# Patient Record
Sex: Female | Born: 1950
Health system: Southern US, Community
[De-identification: ages and names within clinical notes are randomized; demographics above are authoritative.]

## PROBLEM LIST (undated history)

## (undated) DIAGNOSIS — I10 Essential (primary) hypertension: Secondary | ICD-10-CM

## (undated) DIAGNOSIS — E785 Hyperlipidemia, unspecified: Secondary | ICD-10-CM

## (undated) HISTORY — DX: Essential (primary) hypertension: I10

## (undated) HISTORY — PX: ABDOMINAL HYSTERECTOMY: SHX81

## (undated) HISTORY — DX: Hyperlipidemia, unspecified: E78.5

---

## 2004-02-06 ENCOUNTER — Other Ambulatory Visit: Admission: RE | Admit: 2004-02-06 | Discharge: 2004-02-06 | Payer: Self-pay | Admitting: Obstetrics and Gynecology

## 2004-02-21 ENCOUNTER — Encounter: Admission: RE | Admit: 2004-02-21 | Discharge: 2004-02-21 | Payer: Self-pay | Admitting: Obstetrics and Gynecology

## 2004-10-17 ENCOUNTER — Ambulatory Visit: Payer: Self-pay | Admitting: Internal Medicine

## 2004-11-04 ENCOUNTER — Emergency Department (HOSPITAL_COMMUNITY): Admission: EM | Admit: 2004-11-04 | Discharge: 2004-11-04 | Payer: Self-pay | Admitting: Emergency Medicine

## 2005-04-18 ENCOUNTER — Ambulatory Visit: Payer: Self-pay | Admitting: Internal Medicine

## 2007-03-19 ENCOUNTER — Ambulatory Visit: Payer: Self-pay | Admitting: Internal Medicine

## 2008-08-30 ENCOUNTER — Telehealth: Payer: Self-pay | Admitting: Internal Medicine

## 2008-09-05 ENCOUNTER — Ambulatory Visit: Payer: Self-pay | Admitting: Internal Medicine

## 2008-09-05 DIAGNOSIS — E785 Hyperlipidemia, unspecified: Secondary | ICD-10-CM

## 2008-09-05 DIAGNOSIS — I10 Essential (primary) hypertension: Secondary | ICD-10-CM

## 2008-09-05 HISTORY — DX: Essential (primary) hypertension: I10

## 2008-09-05 HISTORY — DX: Hyperlipidemia, unspecified: E78.5

## 2008-09-21 ENCOUNTER — Telehealth: Payer: Self-pay | Admitting: Internal Medicine

## 2008-10-12 ENCOUNTER — Ambulatory Visit: Payer: Self-pay | Admitting: Internal Medicine

## 2009-01-25 ENCOUNTER — Encounter: Admission: RE | Admit: 2009-01-25 | Discharge: 2009-01-25 | Payer: Self-pay | Admitting: Obstetrics and Gynecology

## 2009-07-10 ENCOUNTER — Telehealth: Payer: Self-pay | Admitting: Internal Medicine

## 2009-11-07 ENCOUNTER — Telehealth: Payer: Self-pay | Admitting: Internal Medicine

## 2009-12-05 ENCOUNTER — Telehealth: Payer: Self-pay | Admitting: Internal Medicine

## 2010-01-04 ENCOUNTER — Telehealth (INDEPENDENT_AMBULATORY_CARE_PROVIDER_SITE_OTHER): Payer: Self-pay | Admitting: *Deleted

## 2010-01-24 ENCOUNTER — Telehealth: Payer: Self-pay | Admitting: Internal Medicine

## 2010-02-19 ENCOUNTER — Telehealth: Payer: Self-pay | Admitting: Internal Medicine

## 2010-02-26 ENCOUNTER — Ambulatory Visit: Payer: Self-pay | Admitting: Internal Medicine

## 2011-01-08 NOTE — Progress Notes (Signed)
Summary: OTC MEDs  Phone Note Call from Patient Call back at Work Phone [phone]   Summary of Call: Patient would like otc suggestions for cough. Pt c/o chills &  productive cough with white mucus. No fever, body aches or sinus drainage. She is taking coricidin HBP with little relief.  Initial call taken by: Lamar Sprinkles, CMA,  February 19, 2010 10:31 AM  Follow-up for Phone Call        delsym is excellent for 12 hr cough suppression  - ok to try Follow-up by: Corwin Levins MD,  February 19, 2010 12:48 PM  Additional Follow-up for Phone Call Additional follow up Details #1::        pt informed Additional Follow-up by: Margaret Pyle, CMA,  February 19, 2010 1:39 PM

## 2011-01-08 NOTE — Progress Notes (Signed)
Summary: Accuretic/Norvasc refill  Phone Note Refill Request Message from:  Fax from Pharmacy on January 04, 2010 10:37 AM  Refills Requested: Medication #1:  ACCURETIC 20-12.5 MG TABS 1 by mouth daily   Dosage confirmed as above?Dosage Confirmed   Last Refilled: 12/05/2009  Medication #2:  NORVASC 5 MG TABS 1 by mouth daily   Dosage confirmed as above?Dosage Confirmed   Last Refilled: 12/05/2009 Initial call taken by: Lucious Groves,  January 04, 2010 10:36 AM  Follow-up for Phone Call        routine refills to [facility]bin Follow-up by: Corwin Levins MD,  January 04, 2010 1:16 PM    Prescriptions: NORVASC 5 MG TABS (AMLODIPINE BESYLATE) 1 by mouth daily  #30 x 0   Entered by:   Scharlene Gloss   Authorized by:   Corwin Levins MD   Signed by:   Scharlene Gloss on 01/04/2010   Method used:   Faxed to ...       [facility] Department (retail)       [address] [place], [place]  [address]       Ph: [phone]       Fax: [phone]   RxID:   [id number] ACCURETIC 20-12.5 MG TABS (QUINAPRIL-HYDROCHLOROTHIAZIDE) 1 by mouth daily  #30 x 0   Entered by:   Scharlene Gloss   Authorized by:   Corwin Levins MD   Signed by:   Scharlene Gloss on 01/04/2010   Method used:   Faxed to ...       [facility] Department (retail)       [address] [place], [place]  [address]       Ph: [phone]       Fax: [phone]   RxID:   [id number]

## 2011-01-08 NOTE — Assessment & Plan Note (Signed)
Summary: FOLLOW UP RX REFILL-LB   Vital Signs:  Patient profile:   60 year old female Height:      62 inches Weight:      166 pounds BMI:     30.47 O2 Sat:      98 % on Room air Temp:     97.3 degrees F oral Pulse rate:   100 / minute BP sitting:   140 / 84  (left arm) Cuff size:   regular  Vitals Entered ByZella Ball Ewing (February 26, 2010 2:59 PM)  O2 Flow:  Room air CC: followup on refills, chest congestion, cough/RE   CC:  followup on refills, chest congestion, and cough/RE.  History of Present Illness: here wtih acute onset non prod cough for one wk;  no ST, fever or wheezing; Pt denies CP, sob, doe, wheezing, orthopnea, pnd, worsening LE edema, palps, dizziness or syncope  Hard to sleep with the cough.  just started new job feb 21;  may have insurance later   Preventive Screening-Counseling & Management      Drug Use:  no.    Problems Prior to Update: 1)  Preventive Health Care  (ICD-V70.0) 2)  Hypertension  (ICD-401.9) 3)  Hyperlipidemia  (ICD-272.4)  Medications Prior to Update: 1)  Accuretic 20-12.5 Mg Tabs (Quinapril-Hydrochlorothiazide) .Marland Kitchen.. 1 By Mouth Daily 2)  Norvasc 5 Mg Tabs (Amlodipine Besylate) .Marland Kitchen.. 1 By Mouth Daily 3)  Aspir-81 81 Mg Tbec (Aspirin) 4)  Lipitor 10 Mg Tabs (Atorvastatin Calcium) .Marland Kitchen.. 1po Once Daily 5)  Pravachol 40 Mg Tabs (Pravastatin Sodium) .Marland Kitchen.. 1 By Mouth Once Daily  Current Medications (verified): 1)  Accuretic 20-12.5 Mg Tabs (Quinapril-Hydrochlorothiazide) .Marland Kitchen.. 1 By Mouth Daily 2)  Norvasc 5 Mg Tabs (Amlodipine Besylate) .Marland Kitchen.. 1 By Mouth Daily 3)  Aspir-81 81 Mg Tbec (Aspirin) 4)  Pravachol 40 Mg Tabs (Pravastatin Sodium) .Marland Kitchen.. 1 By Mouth Once Daily 5)  Claritin 10 Mg Tabs (Loratadine) .Marland Kitchen.. 1 By Mouth Once Daily As Needed 6)  Hydrocodone-Homatropine 5-1.5 Mg/62ml Syrp (Hydrocodone-Homatropine) .Marland Kitchen.. 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   Head:  normocephalic and atraumatic.   Eyes:  vision grossly intact, pupils equal, and pupils round.   Ears:  bilat tm's mild red, sinus nontender Nose:  nasal dischargemucosal pallor and mucosal erythema.   Mouth:  pharyngeal erythema and fair dentition.   Neck:  supple and no masses.   Lungs:  normal respiratory effort and normal breath sounds.   Heart:  normal rate and regular rhythm.   Extremities:  no edema, no erythema    Impression & Recommendations:  Problem # 1:  VIRAL INFECTION-UNSPEC (ICD-079.99)  Her updated medication list for this problem includes:    Aspir-81 81 Mg Tbec (Aspirin)    Hydrocodone-homatropine 5-1.5 Mg/88ml Syrp (Hydrocodone-homatropine) .Marland Kitchen... 1 tsp by  mouth q 6 hrs as needed cough treat as above, f/u any worsening signs or symptoms   Problem # 2:  HYPERTENSION (ICD-401.9)  Her updated medication list for this problem includes:    Accuretic 20-12.5 Mg Tabs (Quinapril-hydrochlorothiazide) .Marland Kitchen... 1 by mouth daily    Norvasc 5 Mg Tabs (Amlodipine besylate) .Marland Kitchen... 1 by mouth daily stable overall by hx and exam, ok to continue meds/tx as is   BP today: 140/84 Prior BP: 133/88 (10/12/2008)  Complete Medication List: 1)  Accuretic 20-12.5 Mg Tabs (Quinapril-hydrochlorothiazide) .Marland Kitchen.. 1 by mouth daily 2)  Norvasc 5 Mg Tabs (Amlodipine besylate) .Marland Kitchen.. 1 by  mouth daily 3)  Aspir-81 81 Mg Tbec (Aspirin) 4)  Pravachol 40 Mg Tabs (Pravastatin sodium) .Marland Kitchen.. 1 by mouth once daily 5)  Claritin 10 Mg Tabs (Loratadine) .Marland Kitchen.. 1 by mouth once daily as needed 6)  Hydrocodone-homatropine 5-1.5 Mg/20ml Syrp (Hydrocodone-homatropine) .Marland Kitchen.. 1 tsp by mouth q 6 hrs as needed cough  Patient Instructions: 1)  Please take all new medications as prescribed 2)  Continue all previous medications as before this visit  3)  You can also use Mucinex OTC or it's generic for congestion  4)  Please schedule a follow-up appointment in 6 months with CPX labs 5)  Check your Blood Pressure regularly. If it is above 140/90: you should make an appointment sooner Prescriptions: HYDROCODONE-HOMATROPINE 5-1.5 MG/5ML SYRP (HYDROCODONE-HOMATROPINE) 1 tsp by mouth q 6 hrs as needed cough  #6 oz x 1   Entered and Authorized by:   Corwin Levins MD   Signed by:   Corwin Levins MD on 02/26/2010   Method used:   Print then Give to Patient   RxID:   [id number] ACCURETIC 20-12.5 MG TABS (QUINAPRIL-HYDROCHLOROTHIAZIDE) 1 by mouth daily  #90 x 3   Entered and Authorized by:   Corwin Levins MD   Signed by:   Corwin Levins MD on 02/26/2010   Method used:   Print then Give to Patient   RxID:   [id number] NORVASC 5 MG TABS (AMLODIPINE BESYLATE) 1 by mouth daily  #90 x 3   Entered and Authorized by:   Corwin Levins MD   Signed by:   Corwin Levins MD on 02/26/2010   Method used:   Print then Give to Patient   RxID:   [id number] PRAVACHOL 40 MG TABS (PRAVASTATIN SODIUM) 1 by mouth once daily  #90 x 3   Entered and Authorized by:   Corwin Levins MD   Signed by:   Corwin Levins MD on 02/26/2010   Method used:   Print then Give to Patient   RxID:   [id number]

## 2011-01-08 NOTE — Progress Notes (Signed)
Summary: Lipitor switch  Phone Note Call from Patient Call back at Work Phone [phone]   Caller: Patient Summary of Call: pt called stating that she would like to be switched back to Pravachol. Pt says she is not "comfortable" with Lipitor (pt denied side effects). I reminded pt that Rx was changed originally be cause [facility] said is was no longer free. Pt acknowlegded and said she was prepared to pay out-of-pocket.  Pt request Rx be sent to [facility]. Initial call taken by: Margaret Pyle, CMA,  January 24, 2010 4:09 PM  Follow-up for Phone Call        ok for pravachol 40 mg - to [facility] to handle Follow-up by: Corwin Levins MD,  January 24, 2010 5:15 PM    New/Updated Medications: PRAVACHOL 40 MG TABS (PRAVASTATIN SODIUM) 1 by mouth once daily Prescriptions: PRAVACHOL 40 MG TABS (PRAVASTATIN SODIUM) 1 by mouth once daily  #90 x 3   Entered by:   Scharlene Gloss   Authorized by:   Corwin Levins MD   Signed by:   Scharlene Gloss on 01/25/2010   Method used:   Faxed to ...       [facility] Department (retail)       [address] [place], [place]  [address]       Ph: [phone]       Fax: [phone]   RxID:   [id number]

## 2011-03-11 ENCOUNTER — Other Ambulatory Visit: Payer: Self-pay

## 2011-03-11 MED ORDER — AMLODIPINE BESYLATE 5 MG PO TABS: 5.0000 mg | ORAL_TABLET | Freq: Every day | ORAL | Status: DC

## 2011-03-11 MED ORDER — QUINAPRIL-HYDROCHLOROTHIAZIDE 20-12.5 MG PO TABS: 1.0000 | ORAL_TABLET | Freq: Every day | ORAL | Status: DC

## 2011-03-11 NOTE — Telephone Encounter (Signed)
Refill request Accuretic 2-/12.5 and Norvasc 5 mg. Left refill 02/26/10 last ov 02/17/10.

## 2011-04-17 ENCOUNTER — Other Ambulatory Visit: Payer: Self-pay

## 2011-04-17 MED ORDER — QUINAPRIL-HYDROCHLOROTHIAZIDE 20-12.5 MG PO TABS: 1.0000 | ORAL_TABLET | Freq: Every day | ORAL | Status: DC

## 2011-04-19 ENCOUNTER — Other Ambulatory Visit: Payer: Self-pay

## 2011-04-19 MED ORDER — AMLODIPINE BESYLATE 5 MG PO TABS: 5.0000 mg | ORAL_TABLET | Freq: Every day | ORAL | Status: DC

## 2011-04-25 ENCOUNTER — Encounter: Payer: Self-pay | Admitting: Internal Medicine

## 2011-04-25 ENCOUNTER — Other Ambulatory Visit (INDEPENDENT_AMBULATORY_CARE_PROVIDER_SITE_OTHER): Payer: Self-pay

## 2011-04-25 ENCOUNTER — Telehealth: Payer: Self-pay

## 2011-04-25 ENCOUNTER — Ambulatory Visit (INDEPENDENT_AMBULATORY_CARE_PROVIDER_SITE_OTHER): Payer: Self-pay | Admitting: Internal Medicine

## 2011-04-25 DIAGNOSIS — Z79899 Other long term (current) drug therapy: Secondary | ICD-10-CM | POA: Insufficient documentation

## 2011-04-25 DIAGNOSIS — Z0001 Encounter for general adult medical examination with abnormal findings: Secondary | ICD-10-CM | POA: Insufficient documentation

## 2011-04-25 DIAGNOSIS — E785 Hyperlipidemia, unspecified: Secondary | ICD-10-CM

## 2011-04-25 DIAGNOSIS — J309 Allergic rhinitis, unspecified: Secondary | ICD-10-CM | POA: Insufficient documentation

## 2011-04-25 DIAGNOSIS — I10 Essential (primary) hypertension: Secondary | ICD-10-CM

## 2011-04-25 DIAGNOSIS — Z0000 Encounter for general adult medical examination without abnormal findings: Secondary | ICD-10-CM | POA: Insufficient documentation

## 2011-04-25 LAB — BASIC METABOLIC PANEL
BUN: 7 mg/dL (ref 6–23)
CO2: 30 mEq/L (ref 19–32)
Calcium: 9.1 mg/dL (ref 8.4–10.5)
Chloride: 105 mEq/L (ref 96–112)
Creatinine, Ser: 0.7 mg/dL (ref 0.4–1.2)
GFR: 116.08 mL/min (ref >60.00)
Glucose, Bld: 85 mg/dL (ref 70–99)
Potassium: 3.9 mEq/L (ref 3.5–5.1)
Sodium: 141 mEq/L (ref 135–145)

## 2011-04-25 LAB — HEPATIC FUNCTION PANEL
ALT: 15 U/L (ref 0–35)
AST: 24 U/L (ref 0–37)
Albumin: 3.7 g/dL (ref 3.5–5.2)
Alkaline Phosphatase: 80 U/L (ref 39–117)
Bilirubin, Direct: 0 mg/dL (ref 0.0–0.3)
Total Bilirubin: 0.5 mg/dL (ref 0.3–1.2)
Total Protein: 6.7 g/dL (ref 6.0–8.3)

## 2011-04-25 LAB — LIPID PANEL
Cholesterol: 193 mg/dL (ref 0–200)
HDL: 74.9 mg/dL (ref >39.00)
LDL Cholesterol: 96 mg/dL (ref 0–99)
Total CHOL/HDL Ratio: 3
Triglycerides: 110 mg/dL (ref 0.0–149.0)
VLDL: 22 mg/dL (ref 0.0–40.0)

## 2011-04-25 MED ORDER — FEXOFENADINE HCL 180 MG PO TABS: 180.0000 mg | ORAL_TABLET | Freq: Every day | ORAL | Status: DC

## 2011-04-25 MED ORDER — QUINAPRIL-HYDROCHLOROTHIAZIDE 20-12.5 MG PO TABS: 1.0000 | ORAL_TABLET | Freq: Every day | ORAL | Status: DC

## 2011-04-25 MED ORDER — PRAVASTATIN SODIUM 40 MG PO TABS: 40.0000 mg | ORAL_TABLET | Freq: Every evening | ORAL | Status: DC

## 2011-04-25 MED ORDER — AMLODIPINE BESYLATE 5 MG PO TABS: 5.0000 mg | ORAL_TABLET | Freq: Every day | ORAL | Status: DC

## 2011-04-25 NOTE — Patient Instructions (Signed)
Continue all other medications as before Please go to LAB in the Basement for the blood and/or urine tests to be done today Please call the phone number [phone] (the PhoneTree System) for results of testing in 2-3 days;  When calling, simply dial the number, and when prompted enter the MRN number above (the Medical Record Number) and the # key, then the message should start. Please return in 1 year for your yearly visit, or sooner if needed

## 2011-04-25 NOTE — Telephone Encounter (Signed)
Ok to generate rx - to robin to handle

## 2011-04-25 NOTE — Progress Notes (Signed)
Quick Note:  Voice message left on PhoneTree system - lab is negative, normal or otherwise stable, pt to continue same tx ______ 

## 2011-04-25 NOTE — Telephone Encounter (Signed)
[facility]ax from [facility]. They can no longer obtain Allegra through patient assistance, would you be willing to change to Clarinex 5 mg qd.  Clarinex is the only antihistamine they can obtain free. Please advise.

## 2011-04-25 NOTE — Progress Notes (Signed)
  Subjective:    Patient ID: Claudia Myers, female    DOB: 12-01-1951, 60 y.o.   MRN: [id number]  HPI  Here for wellness and f/u;  Overall doing ok;  Pt denies CP, worsening SOB, DOE, wheezing, orthopnea, PND, worsening LE edema, palpitations, dizziness or syncope.  Pt denies neurological change such as new Headache, facial or extremity weakness.  Pt denies polydipsia, polyuria, or low sugar symptoms. Pt states overall good compliance with treatment and medications, good tolerability, and trying to follow lower cholesterol diet.  Pt denies worsening depressive symptoms, suicidal ideation or panic. No fever, wt loss, night sweats, loss of appetite, or other constitutional symptoms.  Pt states good ability with ADL's, low fall risk, home safety reviewed and adequate, no significant changes in hearing or vision, and occasionally active with exercise.  Works at new job locally. Does have several wks ongoing nasal allergy symptoms with clear congestion, itch and sneeze, without fever, pain, ST, cough or wheezing.  Past Medical History  Diagnosis Date  . HYPERLIPIDEMIA 09/05/2008  . HYPERTENSION 09/05/2008   Past Surgical History  Procedure Date  . Abdominal hysterectomy     reports that she has never smoked. She does not have any smokeless tobacco history on file. She reports that she drinks alcohol. She reports that she does not use illicit drugs. family history includes Diabetes in her mother; Hypertension in her mother; Peripheral vascular disease in her mother; and Stroke in her mother. No Known Allergies Current Outpatient Prescriptions on File Prior to Visit  Medication Sig Dispense Refill  . aspirin 81 MG EC tablet Take 81 mg by mouth daily.        Marland Kitchen DISCONTD: amLODipine (NORVASC) 5 MG tablet Take 1 tablet (5 mg total) by mouth daily.  30 tablet  0  . DISCONTD: quinapril-hydrochlorothiazide (ACCURETIC) 20-12.5 MG per tablet Take 1 tablet by mouth daily.  30 tablet  0  . HYDROcodone-homatropine  (HYCODAN) 5-1.5 MG/5ML syrup 1 teaspoon by mouth every 6 hours as needed for cough       . DISCONTD: atorvastatin (LIPITOR) 20 MG tablet Take 20 mg by mouth daily.        Marland Kitchen DISCONTD: loratadine (CLARITIN) 10 MG tablet Take 10 mg by mouth daily.         Review of Systems All otherwise neg per pt     Objective:   Physical Exam BP 140/100  Pulse 73  Temp(Src) 98.7 F (37.1 C) (Oral)  Ht 5\' 3"  (1.6 m)  Wt 180 lb 8 oz (81.874 kg)  BMI 31.97 kg/m2  SpO2 99% Physical Exam  VS noted Constitutional: Pt appears well-developed and well-nourished.  HENT: Head: Normocephalic.  Right Ear: External ear normal.  Left Ear: External ear normal.  Eyes: Conjunctivae and EOM are normal. Pupils are equal, round, and reactive to light.  Neck: Normal range of motion. Neck supple.  Cardiovascular: Normal rate and regular rhythm.   Pulmonary/Chest: Effort normal and breath sounds normal.  Abd:  Soft, NT, non-distended, + BS Neurological: Pt is alert. No cranial nerve deficit.  Skin: Skin is warm. No erythema.  Psychiatric: Pt behavior is normal. Thought content normal.         Assessment & Plan:

## 2011-04-26 ENCOUNTER — Other Ambulatory Visit: Payer: Self-pay

## 2011-04-26 MED ORDER — DESLORATADINE 5 MG PO TABS: 5.0000 mg | ORAL_TABLET | Freq: Every day | ORAL | Status: DC

## 2011-04-26 NOTE — Telephone Encounter (Signed)
Sent new prescription to pharmacy

## 2011-04-28 ENCOUNTER — Encounter: Payer: Self-pay | Admitting: Internal Medicine

## 2011-04-28 NOTE — Assessment & Plan Note (Signed)
Mild, for allegra prn,  to f/u any worsening symptoms or concerns 

## 2011-04-28 NOTE — Assessment & Plan Note (Signed)
stable overall by hx and exam, most recent lab reviewed with pt, and pt to continue medical treatment as before  Lab Results  Component Value Date   LDLCALC 96 04/25/2011

## 2011-04-28 NOTE — Assessment & Plan Note (Signed)
stable overall by hx and exam, most recent lab reviewed with pt, and pt to continue medical treatment as before BP Readings from Last 3 Encounters:  04/25/11 140/100  02/26/10 140/84  10/12/08 133/88

## 2011-11-05 ENCOUNTER — Other Ambulatory Visit: Payer: Self-pay | Admitting: Obstetrics and Gynecology

## 2011-11-05 ENCOUNTER — Other Ambulatory Visit: Payer: Self-pay | Admitting: Nurse Practitioner

## 2011-11-05 DIAGNOSIS — Z1231 Encounter for screening mammogram for malignant neoplasm of breast: Secondary | ICD-10-CM

## 2011-11-13 ENCOUNTER — Ambulatory Visit
Admission: RE | Admit: 2011-11-13 | Discharge: 2011-11-13 | Disposition: A | Discharge status: Home / Self Care | Payer: Self-pay | Source: Ambulatory Visit | Attending: Nurse Practitioner | Admitting: Nurse Practitioner

## 2011-11-13 DIAGNOSIS — Z1231 Encounter for screening mammogram for malignant neoplasm of breast: Secondary | ICD-10-CM

## 2012-05-13 ENCOUNTER — Other Ambulatory Visit: Payer: Self-pay

## 2012-05-13 MED ORDER — QUINAPRIL-HYDROCHLOROTHIAZIDE 20-12.5 MG PO TABS: 1.0000 | ORAL_TABLET | Freq: Every day | ORAL | Status: DC

## 2012-05-18 ENCOUNTER — Other Ambulatory Visit: Payer: Self-pay | Admitting: *Deleted

## 2012-05-18 NOTE — Telephone Encounter (Signed)
Request Clarinex 5 mg refill [last refill 05.18.13 #90x3-see phone note]-too soon to fill/SLS Denied request.

## 2012-05-21 ENCOUNTER — Ambulatory Visit (INDEPENDENT_AMBULATORY_CARE_PROVIDER_SITE_OTHER): Payer: Self-pay | Admitting: Internal Medicine

## 2012-05-21 ENCOUNTER — Encounter: Payer: Self-pay | Admitting: Internal Medicine

## 2012-05-21 VITALS — BP 132/86 | HR 83 | Temp 98.6°F | Ht 63.0 in | Wt 187.1 lb

## 2012-05-21 DIAGNOSIS — I10 Essential (primary) hypertension: Secondary | ICD-10-CM

## 2012-05-21 DIAGNOSIS — J309 Allergic rhinitis, unspecified: Secondary | ICD-10-CM

## 2012-05-21 DIAGNOSIS — E785 Hyperlipidemia, unspecified: Secondary | ICD-10-CM

## 2012-05-21 MED ORDER — AMLODIPINE BESYLATE 5 MG PO TABS: 5.0000 mg | ORAL_TABLET | Freq: Every day | ORAL | Status: DC

## 2012-05-21 MED ORDER — QUINAPRIL-HYDROCHLOROTHIAZIDE 20-12.5 MG PO TABS: 1.0000 | ORAL_TABLET | Freq: Every day | ORAL | Status: DC

## 2012-05-21 MED ORDER — PRAVASTATIN SODIUM 40 MG PO TABS: 40.0000 mg | ORAL_TABLET | Freq: Every evening | ORAL | Status: DC

## 2012-05-21 NOTE — Progress Notes (Signed)
Subjective:    Patient ID: Claudia Myers, female    DOB: 14-Jan-1951, 61 y.o.   MRN: [id number]  HPI  Here to f/u; overall doing ok,  Pt denies chest pain, increased sob or doe, wheezing, orthopnea, PND, increased LE swelling, palpitations, dizziness or syncope.  Pt denies new neurological symptoms such as new headache, or facial or extremity weakness or numbness   Pt denies polydipsia, polyuria  Pt states overall good compliance with meds, trying to follow lower cholesterol diet, wt overall stable but little exercise however.  Fell at work in Nov, had right knee arthroscopy, with pain to lower back and knee, had MRI knee and procedure per Dr Gaspar Bidding  Has bone density as part of the evaluation - normal per pt Done with PT, no further falls, but has some pain with rainy weather.  Unfort can go back to previous position at [employer] as cant stand for work per ortho due to back and knee pain, and no sitting positions avail.  Not working now, none since jan 2013, thinking of applying for disability.  Pt continues to have recurring LBP without change in severity, bowel or bladder change, fever, wt loss,  worsening LE pain/numbness/weakness, gait change or falls.   Pt denies fever, wt loss, night sweats, loss of appetite, or other constitutional symptoms Past Medical History  Diagnosis Date  . HYPERLIPIDEMIA 09/05/2008  . HYPERTENSION 09/05/2008   Past Surgical History  Procedure Date  . Abdominal hysterectomy     reports that she has never smoked. She does not have any smokeless tobacco history on file. She reports that she drinks alcohol. She reports that she does not use illicit drugs. family history includes Diabetes in her mother; Hypertension in her mother; Peripheral vascular disease in her mother; and Stroke in her mother. No Known Allergies Current Outpatient Prescriptions on File Prior to Visit  Medication Sig Dispense Refill  . aspirin 81 MG EC tablet Take 81 mg by mouth daily.        .  quinapril-hydrochlorothiazide (ACCURETIC) 20-12.5 MG per tablet Take 1 tablet by mouth daily.  90 tablet  3  . amLODipine (NORVASC) 5 MG tablet Take 1 tablet (5 mg total) by mouth daily.  90 tablet  3  . desloratadine (CLARINEX) 5 MG tablet Take 1 tablet (5 mg total) by mouth daily.  90 tablet  3  . fexofenadine (ALLEGRA) 180 MG tablet Take 1 tablet (180 mg total) by mouth daily.  90 tablet  3  . HYDROcodone-homatropine (HYCODAN) 5-1.5 MG/5ML syrup 1 teaspoon by mouth every 6 hours as needed for cough       . pravastatin (PRAVACHOL) 40 MG tablet Take 1 tablet (40 mg total) by mouth every evening.  90 tablet  3   Review of Systems Review of Systems  Constitutional: Negative for diaphoresis and unexpected weight change.  HENT: Negative for drooling and tinnitus.   Eyes: Negative for photophobia and visual disturbance.  Respiratory: Negative for choking and stridor.   Gastrointestinal: Negative for vomiting and blood in stool.  Genitourinary: Negative for hematuria and decreased urine volume.  Musculoskeletal: Negative for gait problem.  Skin: Negative for color change and wound.  Neurological: Negative for tremors and numbness.  Psychiatric/Behavioral: Negative for decreased concentration. The patient is not hyperactive.       Objective:   Physical Exam BP 132/86  Pulse 83  Temp 98.6 F (37 C) (Oral)  Ht 5\' 3"  (1.6 m)  Wt 187 lb 2 oz (  84.879 kg)  BMI 33.15 kg/m2  SpO2 99% Physical Exam  VS noted Constitutional: Pt appears well-developed and well-nourished.  HENT: Head: Normocephalic.  Right Ear: External ear normal.  Left Ear: External ear normal.  Eyes: Conjunctivae and EOM are normal. Pupils are equal, round, and reactive to light.  Neck: Normal range of motion. Neck supple.  Cardiovascular: Normal rate and regular rhythm.   Pulmonary/Chest: Effort normal and breath sounds normal.  Abd:  Soft, NT, non-distended, + BS Neurological: Pt is alert. No cranial nerve deficit.    Skin: Skin is warm. No erythema.  Psychiatric: Pt behavior is normal. Thought content normal.     Assessment & Plan:

## 2012-05-21 NOTE — Assessment & Plan Note (Signed)
stable overall by hx and exam, most recent data reviewed with pt, and pt to continue medical treatment as before BP Readings from Last 3 Encounters:  05/21/12 132/86  04/25/11 140/100  02/26/10 140/84

## 2012-05-21 NOTE — Assessment & Plan Note (Signed)
stable overall by hx and exam, , and pt to continue medical treatment as before, for allegra otc prn

## 2012-05-21 NOTE — Patient Instructions (Addendum)
Continue all other medications as before You are given the hardcopy of your refills today Remember to "shop around" for the best prices, such as [facility], [employer] or [employer] Please return in 1 year for your yearly visit, or sooner if needed You may also wish to consider applying for the "[facility]" that [facility] system grants for some people who applY at the [facility] office

## 2012-05-21 NOTE — Assessment & Plan Note (Signed)
stable overall by hx and exam, most recent data reviewed with pt, and pt to continue medical treatment as before Lab Results  Component Value Date   LDLCALC 96 04/25/2011   [facility] lab today due to cost

## 2012-05-27 ENCOUNTER — Other Ambulatory Visit: Payer: Self-pay

## 2012-05-27 MED ORDER — DESLORATADINE 5 MG PO TABS: 5.0000 mg | ORAL_TABLET | Freq: Every day | ORAL | Status: DC

## 2012-07-30 ENCOUNTER — Telehealth: Payer: Self-pay | Admitting: Internal Medicine

## 2012-07-30 NOTE — Telephone Encounter (Signed)
Caller: Claudia Myers/Patient; Phone: [phone]; Reason for Call: Please call pt to advise when she started seeing Dr Jonny Ruiz; she thinks was 79 but needs month/year.

## 2012-07-31 NOTE — Telephone Encounter (Signed)
Patient informed. 

## 2013-01-05 ENCOUNTER — Ambulatory Visit: Payer: Self-pay

## 2013-01-18 ENCOUNTER — Telehealth: Payer: Self-pay | Admitting: Internal Medicine

## 2013-01-18 MED ORDER — IRBESARTAN-HYDROCHLOROTHIAZIDE 150-12.5 MG PO TABS: 1.0000 | ORAL_TABLET | Freq: Every day | ORAL | Status: DC

## 2013-01-18 NOTE — Telephone Encounter (Signed)
Ok to change to avalide 150/12.20m which should be roughly equivalent to the accuretic she was taking (even the though it was only 20/12.5)

## 2013-01-18 NOTE — Telephone Encounter (Signed)
Faxed hardcopy to [facility]. And informed the patient.

## 2013-01-18 NOTE — Telephone Encounter (Signed)
Patient is requesting a change to her accuretic medication because she is having a bad cough

## 2013-01-27 ENCOUNTER — Telehealth: Payer: Self-pay

## 2013-01-27 MED ORDER — TELMISARTAN-HCTZ 40-12.5 MG PO TABS: 1.0000 | ORAL_TABLET | Freq: Every day | ORAL | Status: DC

## 2013-01-27 NOTE — Telephone Encounter (Signed)
Faxed to [facility].

## 2013-01-27 NOTE — Telephone Encounter (Signed)
rx changed and sent erx

## 2013-01-27 NOTE — Telephone Encounter (Signed)
[facility]. Fax to inform they no longer get avalide would you consider switching  The patient to Micardis HCT or Benicar HCT,  Patient can get free.

## 2013-02-25 ENCOUNTER — Other Ambulatory Visit: Payer: Self-pay

## 2013-02-25 MED ORDER — AMLODIPINE BESYLATE 5 MG PO TABS: 5.0000 mg | ORAL_TABLET | Freq: Every day | ORAL | Status: DC

## 2013-04-29 ENCOUNTER — Telehealth: Payer: Self-pay

## 2013-04-29 NOTE — Telephone Encounter (Signed)
Phone call from pt. Message she left on triage was not understood, voice was muffled. Called her back asking her to call again so we can assist her.

## 2013-05-25 ENCOUNTER — Other Ambulatory Visit: Payer: Self-pay

## 2013-05-25 MED ORDER — AMLODIPINE BESYLATE 5 MG PO TABS: 5.0000 mg | ORAL_TABLET | Freq: Every day | ORAL | Status: DC

## 2015-06-28 ENCOUNTER — Telehealth: Payer: Self-pay | Admitting: Internal Medicine

## 2015-06-28 NOTE — Telephone Encounter (Signed)
Pt called in and wanted to know if you would take her back on has a pt?    Last seen 06/01/2012

## 2015-06-29 NOTE — Telephone Encounter (Signed)
Ok with me 

## 2015-06-30 NOTE — Telephone Encounter (Signed)
Patient currently does not have insurance.  Patient will call back to make an appointment once she has that in place.

## 2016-10-23 ENCOUNTER — Emergency Department (HOSPITAL_COMMUNITY)
Admission: EM | Admit: 2016-10-23 | Discharge: 2016-10-24 | Disposition: A | Discharge status: Home / Self Care | Payer: Self-pay | Attending: Emergency Medicine | Admitting: Emergency Medicine

## 2016-10-23 DIAGNOSIS — I10 Essential (primary) hypertension: Secondary | ICD-10-CM | POA: Insufficient documentation

## 2016-10-23 DIAGNOSIS — Z7982 Long term (current) use of aspirin: Secondary | ICD-10-CM | POA: Insufficient documentation

## 2016-10-23 DIAGNOSIS — Z79899 Other long term (current) drug therapy: Secondary | ICD-10-CM | POA: Insufficient documentation

## 2016-10-23 NOTE — ED Notes (Signed)
Pt called for triage no answer  °

## 2016-10-23 NOTE — ED Notes (Signed)
Pt called for triage, no response. 

## 2016-10-24 ENCOUNTER — Encounter (HOSPITAL_COMMUNITY): Payer: Self-pay | Admitting: *Deleted

## 2016-10-24 LAB — CBC WITH DIFFERENTIAL/PLATELET
Basophils Absolute: 0 10*3/uL (ref 0.0–0.1)
Basophils Relative: 0 %
Eosinophils Absolute: 0.1 10*3/uL (ref 0.0–0.7)
Eosinophils Relative: 1 %
HCT: 26.3 % — ABNORMAL LOW (ref 36.0–46.0)
Hemoglobin: 8.4 g/dL — ABNORMAL LOW (ref 12.0–15.0)
Lymphocytes Relative: 15 %
Lymphs Abs: 1.6 10*3/uL (ref 0.7–4.0)
MCH: 30.1 pg (ref 26.0–34.0)
MCHC: 31.9 g/dL (ref 30.0–36.0)
MCV: 94.3 fL (ref 78.0–100.0)
Monocytes Absolute: 0.4 10*3/uL (ref 0.1–1.0)
Monocytes Relative: 4 %
Neutro Abs: 8.5 10*3/uL — ABNORMAL HIGH (ref 1.7–7.7)
Neutrophils Relative %: 80 %
Platelets: 396 10*3/uL (ref 150–400)
RBC: 2.79 MIL/uL — ABNORMAL LOW (ref 3.87–5.11)
RDW: 13.7 % (ref 11.5–15.5)
WBC: 10.6 10*3/uL — ABNORMAL HIGH (ref 4.0–10.5)

## 2016-10-24 LAB — URINE MICROSCOPIC-ADD ON

## 2016-10-24 LAB — URINALYSIS, ROUTINE W REFLEX MICROSCOPIC
Bilirubin Urine: NEGATIVE
Glucose, UA: NEGATIVE mg/dL
Ketones, ur: NEGATIVE mg/dL
Nitrite: NEGATIVE
Protein, ur: NEGATIVE mg/dL
Specific Gravity, Urine: 1.011 (ref 1.005–1.030)
pH: 5.5 (ref 5.0–8.0)

## 2016-10-24 LAB — COMPREHENSIVE METABOLIC PANEL
ALT: 13 U/L — ABNORMAL LOW (ref 14–54)
AST: 20 U/L (ref 15–41)
Albumin: 3.8 g/dL (ref 3.5–5.0)
Alkaline Phosphatase: 72 U/L (ref 38–126)
Anion gap: 8 (ref 5–15)
BUN: 7 mg/dL (ref 6–20)
CO2: 25 mmol/L (ref 22–32)
Calcium: 9 mg/dL (ref 8.9–10.3)
Chloride: 108 mmol/L (ref 101–111)
Creatinine, Ser: 0.76 mg/dL (ref 0.44–1.00)
GFR calc Af Amer: >60 mL/min (ref >60)
GFR calc non Af Amer: >60 mL/min (ref >60)
Glucose, Bld: 111 mg/dL — ABNORMAL HIGH (ref 65–99)
Potassium: 3.2 mmol/L — ABNORMAL LOW (ref 3.5–5.1)
Sodium: 141 mmol/L (ref 135–145)
Total Bilirubin: 0.5 mg/dL (ref 0.3–1.2)
Total Protein: 6.6 g/dL (ref 6.5–8.1)

## 2016-10-24 LAB — I-STAT TROPONIN, ED: Troponin i, poc: 0 ng/mL (ref 0.00–0.08)

## 2016-10-24 MED ORDER — AMLODIPINE BESYLATE 5 MG PO TABS
5.0000 mg | ORAL_TABLET | Freq: Once | ORAL | Status: AC
  Administered 2016-10-24: 5 mg via ORAL
  Filled 2016-10-24: qty 1

## 2016-10-24 MED ORDER — AMLODIPINE BESYLATE 5 MG PO TABS: 5.0000 mg | ORAL_TABLET | Freq: Every day | ORAL | 0 refills | Status: DC

## 2016-10-24 NOTE — ED Notes (Signed)
Pt came up from triage to registration and states no one has called her.  Pt states she has been sitting in peds waiting for almost 3 hours.

## 2016-10-24 NOTE — ED Provider Notes (Signed)
TIME SEEN:  By signing my name below, I, [name], attest that this documentation has been prepared under the direction and in the presence of Enbridge EnergyKristen N Maksym Pfiffner, DO.  Electronically Signed: Octavia HeirArianna Nassar, ED Scribe. 10/24/16. 1:21 AM.   CHIEF COMPLAINT:  Chief Complaint  Patient presents with  . Hypertension     HPI:  HPI Comments: Festus AloeShirlene C Myers is a 65 y.o. female who presents to the Emergency Department presenting to have her blood pressure checked. Pt is currently asymptomatic. She states that she felt "woozy" yesterday which prompted her to check her blood pressure today. She notes when checking it her blood pressure was 199/119. She has not been on blood pressure medication since 2014 due to not being able to afford it. Pt does not check her blood pressure on a regular basis. She used to be prescribed amlodipine and micardis for her blood pressure. Pt is making an appointment with her PCP soon to be placed back on her HTN medication. She denies headache, visual disturbances, chest pain, shortness of breath, numbness/tingling, and weakness.   PCP: Oliver BarreJames John, MD   ROS: See HPI Constitutional: no fever  Eyes: no drainage  ENT: no runny nose   Cardiovascular:  no chest pain  Resp: no SOB  GI: no vomiting GU: no dysuria Integumentary: no rash  Allergy: no hives  Musculoskeletal: no leg swelling  Neurological: no slurred speech ROS otherwise negative  PAST MEDICAL HISTORY/PAST SURGICAL HISTORY:  Past Medical History:  Diagnosis Date  . HYPERLIPIDEMIA 09/05/2008  . HYPERTENSION 09/05/2008    MEDICATIONS:  Prior to Admission medications   Medication Sig Start Date End Date Taking? Authorizing Provider  amLODipine (NORVASC) 5 MG tablet Take 1 tablet (5 mg total) by mouth daily. 05/25/13 05/25/14  Corwin LevinsJames W John, MD  aspirin 81 MG EC tablet Take 81 mg by mouth daily.      Historical Provider, MD  desloratadine (CLARINEX) 5 MG tablet Take 1 tablet (5 mg total) by mouth daily.  05/27/12 05/27/13  Corwin LevinsJames W John, MD  fexofenadine (ALLEGRA) 180 MG tablet Take 1 tablet (180 mg total) by mouth daily. 04/25/11 04/24/12  Corwin LevinsJames W John, MD  pravastatin (PRAVACHOL) 40 MG tablet Take 1 tablet (40 mg total) by mouth every evening. 05/21/12 05/21/13  Corwin LevinsJames W John, MD  telmisartan-hydrochlorothiazide (MICARDIS HCT) 40-12.5 MG per tablet Take 1 tablet by mouth daily. 01/27/13   Corwin LevinsJames W John, MD    ALLERGIES:  No Known Allergies  SOCIAL HISTORY:  Social History  Substance Use Topics  . Smoking status: Never Smoker  . Smokeless tobacco: Never Used  . Alcohol use Yes    FAMILY HISTORY: Family History  Problem Relation Age of Onset  . Stroke Mother   . Hypertension Mother   . Diabetes Mother   . Peripheral vascular disease Mother     EXAM: BP 199/95 (BP Location: Right Arm)   Pulse 117   Temp 98.6 F (37 C) (Oral)   Resp 16   Ht 5\' 3"  (1.6 m)   SpO2 100%  CONSTITUTIONAL: Alert and oriented and responds appropriately to questions. Well-appearing; well-nourished HEAD: Normocephalic EYES: Conjunctivae clear, PERRL, EOMI ENT: normal nose; no rhinorrhea; moist mucous membranes NECK: Supple, no meningismus, no nuchal rigidity, no LAD  CARD: RRR; S1 and S2 appreciated; no murmurs, no clicks, no rubs, no gallops RESP: Normal chest excursion without splinting or tachypnea; breath sounds clear and equal bilaterally; no wheezes, no rhonchi, no rales, no hypoxia or respiratory distress,  speaking full sentences ABD/GI: Normal bowel sounds; non-distended; soft, non-tender, no rebound, no guarding, no peritoneal signs, no hepatosplenomegaly BACK:  The back appears normal and is non-tender to palpation, there is no CVA tenderness EXT: Normal ROM in all joints; non-tender to palpation; no edema; normal capillary refill; no cyanosis, no calf tenderness or swelling    SKIN: Normal color for age and race; warm; no rash NEURO: Moves all extremities equally, sensation to light touch intact  diffusely, cranial nerves II through XII intact, normal speech; normal gait PSYCH: The patient's mood and manner are appropriate. Grooming and personal hygiene are appropriate.  MEDICAL DECISION MAKING: Patient here with hypertension that is improving with time. Completely asymptomatic. Labs, urine pending to evaluate for signs of end organ damage. We will restart her amlodipine and have encouraged her to follow-up with her primary care physician.  ED PROGRESS: Patient's labs are unremarkable other than a hemoglobin of 8.4 with no old for comparison. Denies bloody stools, melena, vaginal bleeding. Discussed this finding with patient and recommended close follow-up for her anemia. She does not require blood transfusion today. Patient's creatinine is normal. Troponin negative.  Urine shows trace hemoglobin but no protein. Blood pressure has REM improved to 150s/70s of a heart rate in the 90s before amlodipine even given. Have again encouraged her to follow-up with her PCP closely. She is comfortable with this plan.  At this time, I do not feel there is any life-threatening condition present. I have reviewed and discussed all results (EKG, imaging, lab, urine as appropriate) and exam findings with patient/family. I have reviewed nursing notes and appropriate previous records.  I feel the patient is safe to be discharged home without further emergent workup and can continue workup as an outpatient as needed. Discussed usual and customary return precautions. Patient/family verbalize understanding and are comfortable with this plan.  Outpatient follow-up has been provided. All questions have been answered.    EKG Interpretation  Date/Time:  Thursday October 24 2016 00:26:11 EST Ventricular Rate:  113 PR Interval:  168 QRS Duration: 70 QT Interval:  336 QTC Calculation: 460 R Axis:   45 Text Interpretation:  Sinus tachycardia Otherwise normal ECG No significant change since last tracing other than rate  being faster Confirmed by Daquane Aguilar,  DO, Johnpatrick Jenny ([id number]) on 10/24/2016 12:36:43 AM        I personally performed the services described in this documentation, which was scribed in my presence. The recorded information has been reviewed and is accurate.     Layla MawKristen N Delon Revelo, DO 10/24/16 [phone]

## 2016-10-24 NOTE — ED Triage Notes (Signed)
Patient presents with c/o her BP being elevated  Has not been able to see her PMD due to being out of work

## 2016-11-13 ENCOUNTER — Other Ambulatory Visit (INDEPENDENT_AMBULATORY_CARE_PROVIDER_SITE_OTHER): Payer: Self-pay

## 2016-11-13 ENCOUNTER — Encounter: Payer: Self-pay | Admitting: Internal Medicine

## 2016-11-13 ENCOUNTER — Ambulatory Visit (INDEPENDENT_AMBULATORY_CARE_PROVIDER_SITE_OTHER): Payer: Self-pay | Admitting: Internal Medicine

## 2016-11-13 VITALS — BP 136/72 | HR 100 | Temp 98.1°F | Resp 20 | Wt 167.0 lb

## 2016-11-13 DIAGNOSIS — R739 Hyperglycemia, unspecified: Secondary | ICD-10-CM

## 2016-11-13 DIAGNOSIS — D509 Iron deficiency anemia, unspecified: Secondary | ICD-10-CM

## 2016-11-13 DIAGNOSIS — E876 Hypokalemia: Secondary | ICD-10-CM

## 2016-11-13 DIAGNOSIS — D649 Anemia, unspecified: Secondary | ICD-10-CM | POA: Insufficient documentation

## 2016-11-13 DIAGNOSIS — I10 Essential (primary) hypertension: Secondary | ICD-10-CM

## 2016-11-13 DIAGNOSIS — Z23 Encounter for immunization: Secondary | ICD-10-CM

## 2016-11-13 LAB — CBC WITH DIFFERENTIAL/PLATELET
BASOS ABS: 0 10*3/uL (ref 0.0–0.1)
Basophils Relative: 0.4 % (ref 0.0–3.0)
EOS ABS: 0.1 10*3/uL (ref 0.0–0.7)
Eosinophils Relative: 1 % (ref 0.0–5.0)
HEMATOCRIT: 33.9 % — AB (ref 36.0–46.0)
Hemoglobin: 11.1 g/dL — ABNORMAL LOW (ref 12.0–15.0)
LYMPHS ABS: 2.2 10*3/uL (ref 0.7–4.0)
LYMPHS PCT: 24.1 % (ref 12.0–46.0)
MCHC: 32.8 g/dL (ref 30.0–36.0)
MCV: 93.7 fl (ref 78.0–100.0)
MONOS PCT: 5.4 % (ref 3.0–12.0)
Monocytes Absolute: 0.5 10*3/uL (ref 0.1–1.0)
NEUTROS ABS: 6.2 10*3/uL (ref 1.4–7.7)
NEUTROS PCT: 69.1 % (ref 43.0–77.0)
PLATELETS: 555 10*3/uL — AB (ref 150.0–400.0)
RBC: 3.62 Mil/uL — AB (ref 3.87–5.11)
RDW: 14.2 % (ref 11.5–15.5)
WBC: 9 10*3/uL (ref 4.0–10.5)

## 2016-11-13 LAB — BASIC METABOLIC PANEL
BUN: 5 mg/dL — ABNORMAL LOW (ref 6–23)
CALCIUM: 9.8 mg/dL (ref 8.4–10.5)
CO2: 29 meq/L (ref 19–32)
CREATININE: 0.79 mg/dL (ref 0.40–1.20)
Chloride: 103 mEq/L (ref 96–112)
GFR: 94.23 mL/min (ref >60.00)
Glucose, Bld: 103 mg/dL — ABNORMAL HIGH (ref 70–99)
Potassium: 4 mEq/L (ref 3.5–5.1)
SODIUM: 141 meq/L (ref 135–145)

## 2016-11-13 LAB — IBC PANEL
Iron: 23 ug/dL — ABNORMAL LOW (ref 42–145)
SATURATION RATIOS: 4.8 % — AB (ref 20.0–50.0)
Transferrin: 343 mg/dL (ref 212.0–360.0)

## 2016-11-13 LAB — HEMOGLOBIN A1C: HEMOGLOBIN A1C: 4.5 % — AB (ref 4.6–6.5)

## 2016-11-13 NOTE — Assessment & Plan Note (Addendum)
Suspect possible chronic blood loss, for f/u lab, declines GI
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
## 2016-11-13 NOTE — Progress Notes (Signed)
Pre visit review using our clinic review tool, if applicable. No additional management support is needed unless otherwise documented below in the visit note. 

## 2016-11-13 NOTE — Patient Instructions (Signed)
You had the flu shot today  Please take the OTC iron three times per day, but also take the miralax or stool softner with it to avoid constipation  Please continue all other medications as before, including the blood pressure medication  Please have the pharmacy call with any other refills you may need.  Please keep your appointments with your specialists as you may have planned  Please go to the LAB in the Basement (turn left off the elevator) for the tests to be done today  Please return in 2 months for a repeat blood count  We will need to consider referral back to Dr Loreta AveMann if your anemia is confirmed  You will be contacted by phone if any changes need to be made immediately.  Otherwise, you will receive a letter about your results with an explanation, but please check with MyChart first.  Please remember to sign up for MyChart if you have not done so, as this will be important to you in the future with finding out test results, communicating by private email, and scheduling acute appointments online when needed.  Please return in 1 year for your yearly visit, or sooner if needed

## 2016-11-14 ENCOUNTER — Encounter: Payer: Self-pay | Admitting: Internal Medicine

## 2016-11-14 NOTE — Progress Notes (Unsigned)
Ok for referral?

## 2016-11-17 NOTE — Assessment & Plan Note (Signed)
stable overall by history and exam, recent data reviewed with pt, and pt to continue medical treatment as before,  to f/u any worsening symptoms or concerns BP Readings from Last 3 Encounters:  11/13/16 136/72  10/24/16 152/79  05/21/12 132/86

## 2016-11-17 NOTE — Assessment & Plan Note (Signed)
Mild, improved,  Lab Results  Component Value Date   K 4.0 11/13/2016

## 2016-11-17 NOTE — Assessment & Plan Note (Signed)
Asympt,  Lab Results  Component Value Date   HGBA1C 4.5 (L) 11/13/2016   stable overall by history and exam, recent data reviewed with pt, and pt to continue medical treatment as before,  to f/u any worsening symptoms or concerns

## 2017-01-21 ENCOUNTER — Telehealth: Payer: Self-pay | Admitting: Internal Medicine

## 2017-01-21 MED ORDER — AMLODIPINE BESYLATE 5 MG PO TABS: 5.0000 mg | ORAL_TABLET | Freq: Every day | ORAL | 0 refills | Status: DC

## 2017-01-21 NOTE — Telephone Encounter (Signed)
Medication refill sent to pharmacy  

## 2017-01-21 NOTE — Telephone Encounter (Signed)
Pt needs a refill on her amLODipine (NORVASC) 5 MG tablet [[id number]][[id number]]    [facility] [address]

## 2017-01-22 MED ORDER — AMLODIPINE BESYLATE 5 MG PO TABS
5.0000 mg | ORAL_TABLET | Freq: Every day | ORAL | 0 refills | Status: DC
Start: 2017-01-22 — End: 2017-04-19

## 2017-01-22 NOTE — Telephone Encounter (Signed)
rx was printed resent [facility] to [facility].../lmb

## 2017-01-22 NOTE — Addendum Note (Signed)
Addended by: Deatra JamesBRAND, Jayel Inks M on: 01/22/2017 09:49 AM   Modules accepted: Orders

## 2017-03-10 ENCOUNTER — Telehealth: Payer: Self-pay | Admitting: Internal Medicine

## 2017-03-10 NOTE — Telephone Encounter (Signed)
Routing to dr john, please advise, thanks 

## 2017-03-10 NOTE — Telephone Encounter (Signed)
Pt called wanting to know if Dr Jonny Ruiz could send in a prescription for a medication for allergies. She said that he has prescribed a little blue pill for allergies before. [facility] would need to [facility] to [facility] [address]. Please advise. Thanks [employer]

## 2017-03-11 MED ORDER — FEXOFENADINE HCL 180 MG PO TABS: 180.0000 mg | ORAL_TABLET | Freq: Every day | ORAL | 1 refills | Status: DC

## 2017-03-11 NOTE — Telephone Encounter (Signed)
I am not sure about pill colors, but ok for allegra  - done erx

## 2017-03-12 NOTE — Telephone Encounter (Signed)
Left message advising that rx has been sent to [facility]

## 2017-04-19 ENCOUNTER — Other Ambulatory Visit: Payer: Self-pay | Admitting: Internal Medicine

## 2017-11-23 ENCOUNTER — Other Ambulatory Visit: Payer: Self-pay | Admitting: Internal Medicine

## 2017-11-27 ENCOUNTER — Other Ambulatory Visit: Payer: Self-pay | Admitting: Internal Medicine

## 2018-02-22 ENCOUNTER — Other Ambulatory Visit: Payer: Self-pay | Admitting: Internal Medicine

## 2018-03-02 ENCOUNTER — Other Ambulatory Visit: Payer: Self-pay | Admitting: Internal Medicine

## 2018-03-03 ENCOUNTER — Other Ambulatory Visit: Payer: Self-pay | Admitting: Internal Medicine

## 2018-03-03 DIAGNOSIS — Z1231 Encounter for screening mammogram for malignant neoplasm of breast: Secondary | ICD-10-CM

## 2018-03-04 ENCOUNTER — Ambulatory Visit (INDEPENDENT_AMBULATORY_CARE_PROVIDER_SITE_OTHER): Payer: Medicare Other | Admitting: Internal Medicine

## 2018-03-04 ENCOUNTER — Encounter: Payer: Self-pay | Admitting: Internal Medicine

## 2018-03-04 VITALS — BP 152/88 | HR 96 | Temp 98.2°F | Ht 63.0 in | Wt 166.0 lb

## 2018-03-04 DIAGNOSIS — I10 Essential (primary) hypertension: Secondary | ICD-10-CM

## 2018-03-04 MED ORDER — AMLODIPINE BESYLATE 5 MG PO TABS: 5.0000 mg | ORAL_TABLET | Freq: Every day | ORAL | 0 refills | Status: DC

## 2018-03-04 NOTE — Assessment & Plan Note (Signed)
To restart norvasc 5 qd, pt not examined today, for f/u 3 months

## 2018-03-04 NOTE — Progress Notes (Signed)
   Subjective:    Patient ID: Claudia AloeShirlene C Ruz, female    DOB: 07/15/1951, 67 y.o.   MRN: [id number]  HPI  Pt not seen today due to MD running late  Review of Systems     Objective:   Physical Exam BP (!) 152/88 (BP Location: Left Arm, Patient Position: Sitting, Cuff Size: Normal)   Pulse 96   Temp 98.2 F (36.8 C) (Oral)   Ht 5\' 3"  (1.6 m)   Wt 166 lb (75.3 kg)   SpO2 98%   BMI 29.41 kg/m         Assessment & Plan:

## 2018-03-04 NOTE — Patient Instructions (Signed)
Pt not seen today due to needing to leave/MD running late  Norvasc refilled for 90 days  Will call pt to encourage f/u visit

## 2018-03-05 ENCOUNTER — Telehealth: Payer: Self-pay

## 2018-03-05 NOTE — Telephone Encounter (Signed)
Patient states she wants to wait until closer to her appointment before she schedules.

## 2018-03-05 NOTE — Telephone Encounter (Signed)
Routing to tammy/sched---can you please try to reach patient and schedule 3 mo follow up?  thanks

## 2018-03-05 NOTE — Telephone Encounter (Signed)
-----   Message from Corwin LevinsJames W John, MD sent at 03/04/2018  5:33 PM EDT ----- Staff to call pt to suggest to pt to reschedule in 3 months, since we were not able to see her today

## 2018-05-06 ENCOUNTER — Ambulatory Visit
Admission: RE | Admit: 2018-05-06 | Discharge: 2018-05-06 | Disposition: A | Discharge status: Home / Self Care | Payer: Medicare Other | Source: Ambulatory Visit | Attending: Internal Medicine | Admitting: Internal Medicine

## 2018-05-06 DIAGNOSIS — Z1231 Encounter for screening mammogram for malignant neoplasm of breast: Secondary | ICD-10-CM

## 2018-05-26 ENCOUNTER — Other Ambulatory Visit: Payer: Self-pay | Admitting: Internal Medicine

## 2018-06-01 ENCOUNTER — Encounter: Payer: Self-pay | Admitting: Internal Medicine

## 2018-06-01 ENCOUNTER — Ambulatory Visit (INDEPENDENT_AMBULATORY_CARE_PROVIDER_SITE_OTHER): Payer: Medicare Other | Admitting: Internal Medicine

## 2018-06-01 ENCOUNTER — Other Ambulatory Visit: Payer: Self-pay | Admitting: Internal Medicine

## 2018-06-01 ENCOUNTER — Other Ambulatory Visit (INDEPENDENT_AMBULATORY_CARE_PROVIDER_SITE_OTHER): Payer: Medicare Other

## 2018-06-01 VITALS — BP 166/102 | HR 95 | Temp 97.6°F | Ht 63.0 in | Wt 169.0 lb

## 2018-06-01 DIAGNOSIS — I10 Essential (primary) hypertension: Secondary | ICD-10-CM

## 2018-06-01 DIAGNOSIS — Z0000 Encounter for general adult medical examination without abnormal findings: Secondary | ICD-10-CM

## 2018-06-01 DIAGNOSIS — R739 Hyperglycemia, unspecified: Secondary | ICD-10-CM

## 2018-06-01 DIAGNOSIS — Z23 Encounter for immunization: Secondary | ICD-10-CM | POA: Diagnosis not present

## 2018-06-01 DIAGNOSIS — D509 Iron deficiency anemia, unspecified: Secondary | ICD-10-CM

## 2018-06-01 DIAGNOSIS — E2839 Other primary ovarian failure: Secondary | ICD-10-CM

## 2018-06-01 LAB — LIPID PANEL
CHOLESTEROL: 321 mg/dL — AB (ref 0–200)
HDL: 96 mg/dL (ref >39.00)
NonHDL: 224.7
Total CHOL/HDL Ratio: 3
Triglycerides: 224 mg/dL — ABNORMAL HIGH (ref 0.0–149.0)
VLDL: 44.8 mg/dL — ABNORMAL HIGH (ref 0.0–40.0)

## 2018-06-01 LAB — CBC WITH DIFFERENTIAL/PLATELET
BASOS PCT: 0.2 % (ref 0.0–3.0)
Basophils Absolute: 0 10*3/uL (ref 0.0–0.1)
Eosinophils Absolute: 0.1 10*3/uL (ref 0.0–0.7)
Eosinophils Relative: 1.5 % (ref 0.0–5.0)
HEMATOCRIT: 40.2 % (ref 36.0–46.0)
HEMOGLOBIN: 13.7 g/dL (ref 12.0–15.0)
LYMPHS PCT: 24 % (ref 12.0–46.0)
Lymphs Abs: 2.4 10*3/uL (ref 0.7–4.0)
MCHC: 34.1 g/dL (ref 30.0–36.0)
MCV: 91.1 fl (ref 78.0–100.0)
MONO ABS: 0.5 10*3/uL (ref 0.1–1.0)
MONOS PCT: 5.3 % (ref 3.0–12.0)
Neutro Abs: 6.9 10*3/uL (ref 1.4–7.7)
Neutrophils Relative %: 69 % (ref 43.0–77.0)
Platelets: 425 10*3/uL — ABNORMAL HIGH (ref 150.0–400.0)
RBC: 4.41 Mil/uL (ref 3.87–5.11)
RDW: 13.3 % (ref 11.5–15.5)
WBC: 10 10*3/uL (ref 4.0–10.5)

## 2018-06-01 LAB — BASIC METABOLIC PANEL
BUN: 9 mg/dL (ref 6–23)
CALCIUM: 10 mg/dL (ref 8.4–10.5)
CO2: 28 mEq/L (ref 19–32)
Chloride: 99 mEq/L (ref 96–112)
Creatinine, Ser: 0.8 mg/dL (ref 0.40–1.20)
GFR: 92.42 mL/min (ref >60.00)
GLUCOSE: 101 mg/dL — AB (ref 70–99)
Potassium: 3.5 mEq/L (ref 3.5–5.1)
SODIUM: 138 meq/L (ref 135–145)

## 2018-06-01 LAB — URINALYSIS, ROUTINE W REFLEX MICROSCOPIC
Bilirubin Urine: NEGATIVE
KETONES UR: NEGATIVE
Nitrite: NEGATIVE
Total Protein, Urine: NEGATIVE
Urine Glucose: NEGATIVE
Urobilinogen, UA: 0.2 (ref 0.0–1.0)
pH: 6.5 (ref 5.0–8.0)

## 2018-06-01 LAB — HEPATIC FUNCTION PANEL
ALT: 10 U/L (ref 0–35)
AST: 17 U/L (ref 0–37)
Albumin: 4.7 g/dL (ref 3.5–5.2)
Alkaline Phosphatase: 103 U/L (ref 39–117)
BILIRUBIN TOTAL: 0.7 mg/dL (ref 0.2–1.2)
Bilirubin, Direct: 0.1 mg/dL (ref 0.0–0.3)
TOTAL PROTEIN: 8.5 g/dL — AB (ref 6.0–8.3)

## 2018-06-01 LAB — TSH: TSH: 1.05 u[IU]/mL (ref 0.35–4.50)

## 2018-06-01 LAB — LDL CHOLESTEROL, DIRECT: Direct LDL: 181 mg/dL

## 2018-06-01 LAB — IBC PANEL
IRON: 90 ug/dL (ref 42–145)
Saturation Ratios: 21.5 % (ref 20.0–50.0)
TRANSFERRIN: 299 mg/dL (ref 212.0–360.0)

## 2018-06-01 LAB — HEMOGLOBIN A1C: HEMOGLOBIN A1C: 5.7 % (ref 4.6–6.5)

## 2018-06-01 MED ORDER — AMLODIPINE BESYLATE 5 MG PO TABS: 5.0000 mg | ORAL_TABLET | Freq: Every day | ORAL | 3 refills | Status: DC

## 2018-06-01 MED ORDER — ZOSTER VAC RECOMB ADJUVANTED 50 MCG/0.5ML IM SUSR: 0.5000 mL | Freq: Once | INTRAMUSCULAR | 1 refills | Status: AC

## 2018-06-01 MED ORDER — ROSUVASTATIN CALCIUM 20 MG PO TABS: 20.0000 mg | ORAL_TABLET | Freq: Every day | ORAL | 3 refills | Status: DC

## 2018-06-01 NOTE — Progress Notes (Signed)
Subjective:    Patient ID: Claudia Myers, female    DOB: [birth year], 67 y.o.   MRN: [id number]  HPI  Here for wellness and f/u;  Overall doing ok;  Pt denies Chest pain, worsening SOB, DOE, wheezing, orthopnea, PND, worsening LE edema, palpitations, dizziness or syncope.  Pt denies neurological change such as new headache, facial or extremity weakness.  Pt denies polydipsia, polyuria, or low sugar symptoms. Pt states overall good compliance with treatment and medications, good tolerability, and has been trying to follow appropriate diet.  Pt denies worsening depressive symptoms, suicidal ideation or panic. No fever, night sweats, wt loss, loss of appetite, or other constitutional symptoms.  Pt states good ability with ADL's, has low fall risk, home safety reviewed and adequate, no other significant changes in hearing or vision, and only occasionally active with exercise.  No new compalints Past Medical History:  Diagnosis Date  . HYPERLIPIDEMIA 09/05/2008  . HYPERTENSION 09/05/2008   Past Surgical History:  Procedure Laterality Date  . ABDOMINAL HYSTERECTOMY      reports that she has never smoked. She has never used smokeless tobacco. She reports that she drinks alcohol. She reports that she does not use drugs. family history includes Diabetes in her mother; Hypertension in her mother; Peripheral vascular disease in her mother; Stroke in her mother. No Known Allergies No current outpatient medications on file prior to visit.   No current facility-administered medications on file prior to visit.     Review of Systems Constitutional: Negative for other unusual diaphoresis, sweats, appetite or weight changes HENT: Negative for other worsening hearing loss, ear pain, facial swelling, mouth sores or neck stiffness.   Eyes: Negative for other worsening pain, redness or other visual disturbance.  Respiratory: Negative for other stridor or swelling Cardiovascular: Negative for other palpitations  or other chest pain  Gastrointestinal: Negative for worsening diarrhea or loose stools, blood in stool, distention or other pain Genitourinary: Negative for hematuria, flank pain or other change in urine volume.  Musculoskeletal: Negative for myalgias or other joint swelling.  Skin: Negative for other color change, or other wound or worsening drainage.  Neurological: Negative for other syncope or numbness. Hematological: Negative for other adenopathy or swelling Psychiatric/Behavioral: Negative for hallucinations, other worsening agitation, SI, self-injury, or new decreased concentration All other system neg per pt    Objective:   Physical Exam BP (!) 166/102   Pulse 95   Temp 97.6 F (36.4 C) (Oral)   Ht 5\' 3"  (1.6 m)   Wt 169 lb (76.7 kg)   SpO2 95%   BMI 29.94 kg/m  VS noted,  Constitutional: Pt is oriented to person, place, and time. Appears well-developed and well-nourished, in no significant distress and comfortable Head: Normocephalic and atraumatic  Eyes: Conjunctivae and EOM are normal. Pupils are equal, round, and reactive to light Right Ear: External ear normal without discharge Left Ear: External ear normal without discharge Nose: Nose without discharge or deformity Mouth/Throat: Oropharynx is without other ulcerations and moist  Neck: Normal range of motion. Neck supple. No JVD present. No tracheal deviation present or significant neck LA or mass Cardiovascular: Normal rate, regular rhythm, normal heart sounds and intact distal pulses.  Pulmonary/Chest: WOB normal and breath sounds without rales or wheezing  Abdominal: Soft. Bowel sounds are normal. NT. No HSM  Musculoskeletal: Normal range of motion. Exhibits no edema Lymphadenopathy: Has no other cervical adenopathy.  Neurological: Pt is alert and oriented to person, place, and time. Pt has  normal reflexes. No cranial nerve deficit. Motor grossly intact, Gait intact Skin: Skin is warm and dry. No rash noted or new  ulcerations Psychiatric:  Has normal mood and affect. Behavior is normal without agitation No other exam findings Lab Results  Component Value Date   WBC 9.0 11/13/2016   HGB 11.1 (L) 11/13/2016   HCT 33.9 (L) 11/13/2016   PLT 555.0 (H) 11/13/2016   GLUCOSE 103 (H) 11/13/2016   CHOL 193 04/25/2011   TRIG 110.0 04/25/2011   HDL 74.90 04/25/2011   LDLCALC 96 04/25/2011   ALT 13 (L) 10/24/2016   AST 20 10/24/2016   NA 141 11/13/2016   K 4.0 11/13/2016   CL 103 11/13/2016   CREATININE 0.79 11/13/2016   BUN 5 (L) 11/13/2016   CO2 29 11/13/2016   HGBA1C 4.5 (L) 11/13/2016       Assessment & Plan:

## 2018-06-01 NOTE — Assessment & Plan Note (Signed)
Asympt, to restart meds, o/w stable overall by history and exam, recent data reviewed with pt, and pt to continue medical treatment as before,  to f/u any worsening symptoms or concerns BP Readings from Last 3 Encounters:  06/01/18 (!) 166/102  03/04/18 (!) 152/88  11/13/16 136/72

## 2018-06-01 NOTE — Assessment & Plan Note (Signed)
For GI referral, due for colonoscopy, likely needs EGD as well

## 2018-06-01 NOTE — Assessment & Plan Note (Signed)

## 2018-06-01 NOTE — Addendum Note (Signed)
Addended by: Roney MansGAY, Avia Merkley on: 06/01/2018 02:28 PM   Modules accepted: Orders

## 2018-06-01 NOTE — Assessment & Plan Note (Signed)
Asympt, for a1c with labs 

## 2018-06-01 NOTE — Patient Instructions (Addendum)
Your shingles shot prescription was sent to the pharmacy  You had Prevnar 13 pneumonia shot today in the office  We can plan on the Tdap tetanus and Pneumovax shots for next year  You will be contacted regarding the referral for: colonoscopy by seeing Gastroenterology  Please schedule the bone density test before leaving today at the scheduling desk (where you check out)  Please continue all other medications as before, and refills have been done if requested.  Please have the pharmacy call with any other refills you may need.  Please continue your efforts at being more active, low cholesterol diet, and weight control.  You are otherwise up to date with prevention measures today.  Please keep your appointments with your specialists as you may have planned  Please go to the LAB in the Basement (turn left off the elevator) for the tests to be done today  You will be contacted by phone if any changes need to be made immediately.  Otherwise, you will receive a letter about your results with an explanation, but please check with MyChart first.  Please remember to sign up for MyChart if you have not done so, as this will be important to you in the future with finding out test results, communicating by private email, and scheduling acute appointments online when needed.  Please return in 1 year for your yearly visit, or sooner if needed, with Lab testing done 3-5 days before

## 2018-06-02 ENCOUNTER — Telehealth: Payer: Self-pay

## 2018-06-02 NOTE — Telephone Encounter (Signed)
Pt has been informed and expressed understanding.  

## 2018-06-02 NOTE — Telephone Encounter (Signed)
Patient called back with [facility] regarding if she should continue taking iron  Dr Jonny RuizJohn note on 06/01/2018 states continue medical treatment as before  Patient advised of above in regards to iron

## 2018-06-02 NOTE — Telephone Encounter (Signed)
-----   Message from Corwin LevinsJames W John, MD sent at 06/01/2018  7:54 PM EDT ----- Left message on MyChart, pt to cont same tx except  The test results show that your current treatment is OK, except the LDL cholesterol is severely elevated.  Please follow a lower cholesterol diet, but also please start Crestor 20 mg per day, as this will reduce your heart and stroke risk in the future.    Shirron to please inform pt, I will do rx

## 2018-06-21 DIAGNOSIS — H5213 Myopia, bilateral: Secondary | ICD-10-CM | POA: Diagnosis not present

## 2018-09-09 ENCOUNTER — Telehealth: Payer: Self-pay | Admitting: Internal Medicine

## 2018-09-09 DIAGNOSIS — M25561 Pain in right knee: Secondary | ICD-10-CM

## 2018-09-09 NOTE — Addendum Note (Signed)
Addended by: Corwin Levins on: 09/09/2018 12:50 PM   Modules accepted: Orders

## 2018-09-09 NOTE — Telephone Encounter (Signed)
Copied from CRM [id number]. Topic: Inquiry >> Sep 09, 2018  9:34 AM [name] wrote: Reason for CRM: Pt called regarding pain in right knee. She said it is hurting really bad and she is wanting to go to an orthopedic doctor. Her friend is recommending another doctor for her, Dr. [facility]. Pt asking if this would be ok with Dr. Jonny Ruiz. Pt also asking if she should continue OTC Iron pill. She takes it every other day. Pt also taking fish oil every other day.

## 2018-09-09 NOTE — Telephone Encounter (Signed)
Ok this is done 

## 2018-10-28 DIAGNOSIS — M25561 Pain in right knee: Secondary | ICD-10-CM | POA: Diagnosis not present

## 2018-10-28 DIAGNOSIS — M25562 Pain in left knee: Secondary | ICD-10-CM | POA: Insufficient documentation

## 2018-11-24 ENCOUNTER — Ambulatory Visit: Payer: Self-pay

## 2018-11-24 NOTE — Telephone Encounter (Signed)
Pt. Called back.Instructed she can take Aleve with her current medications. Verbalizes understanding.

## 2018-11-24 NOTE — Telephone Encounter (Signed)
Returned call to patient. Left VM for pt to return call to office.

## 2018-12-09 HISTORY — PX: EYE SURGERY: SHX253

## 2019-05-10 ENCOUNTER — Other Ambulatory Visit: Payer: Self-pay | Admitting: Internal Medicine

## 2019-06-03 ENCOUNTER — Ambulatory Visit: Payer: Medicare Other

## 2019-06-03 ENCOUNTER — Encounter: Payer: Medicare Other | Admitting: Internal Medicine

## 2019-06-17 DIAGNOSIS — Z1211 Encounter for screening for malignant neoplasm of colon: Secondary | ICD-10-CM | POA: Diagnosis not present

## 2019-07-06 ENCOUNTER — Other Ambulatory Visit: Payer: Self-pay | Admitting: Internal Medicine

## 2019-07-06 DIAGNOSIS — Z1231 Encounter for screening mammogram for malignant neoplasm of breast: Secondary | ICD-10-CM

## 2019-07-08 ENCOUNTER — Other Ambulatory Visit: Payer: Self-pay

## 2019-07-08 ENCOUNTER — Encounter: Payer: Self-pay | Admitting: Internal Medicine

## 2019-07-08 ENCOUNTER — Other Ambulatory Visit (INDEPENDENT_AMBULATORY_CARE_PROVIDER_SITE_OTHER): Payer: Medicare Other

## 2019-07-08 ENCOUNTER — Ambulatory Visit (INDEPENDENT_AMBULATORY_CARE_PROVIDER_SITE_OTHER): Payer: Medicare Other | Admitting: Internal Medicine

## 2019-07-08 VITALS — BP 174/122 | HR 123 | Temp 98.1°F | Ht 63.0 in | Wt 172.0 lb

## 2019-07-08 DIAGNOSIS — Z23 Encounter for immunization: Secondary | ICD-10-CM

## 2019-07-08 DIAGNOSIS — D509 Iron deficiency anemia, unspecified: Secondary | ICD-10-CM

## 2019-07-08 DIAGNOSIS — Z1159 Encounter for screening for other viral diseases: Secondary | ICD-10-CM | POA: Diagnosis not present

## 2019-07-08 DIAGNOSIS — E538 Deficiency of other specified B group vitamins: Secondary | ICD-10-CM

## 2019-07-08 DIAGNOSIS — R739 Hyperglycemia, unspecified: Secondary | ICD-10-CM

## 2019-07-08 DIAGNOSIS — Z0000 Encounter for general adult medical examination without abnormal findings: Secondary | ICD-10-CM

## 2019-07-08 DIAGNOSIS — E559 Vitamin D deficiency, unspecified: Secondary | ICD-10-CM

## 2019-07-08 DIAGNOSIS — I10 Essential (primary) hypertension: Secondary | ICD-10-CM

## 2019-07-08 LAB — CBC WITH DIFFERENTIAL/PLATELET
Basophils Absolute: 0 10*3/uL (ref 0.0–0.1)
Basophils Relative: 0.2 % (ref 0.0–3.0)
Eosinophils Absolute: 0.1 10*3/uL (ref 0.0–0.7)
Eosinophils Relative: 0.5 % (ref 0.0–5.0)
HCT: 42.9 % (ref 36.0–46.0)
Hemoglobin: 14.2 g/dL (ref 12.0–15.0)
Lymphocytes Relative: 20.5 % (ref 12.0–46.0)
Lymphs Abs: 2.1 10*3/uL (ref 0.7–4.0)
MCHC: 33 g/dL (ref 30.0–36.0)
MCV: 93.1 fl (ref 78.0–100.0)
Monocytes Absolute: 0.6 10*3/uL (ref 0.1–1.0)
Monocytes Relative: 5.4 % (ref 3.0–12.0)
Neutro Abs: 7.7 10*3/uL (ref 1.4–7.7)
Neutrophils Relative %: 73.4 % (ref 43.0–77.0)
Platelets: 396 10*3/uL (ref 150.0–400.0)
RBC: 4.6 Mil/uL (ref 3.87–5.11)
RDW: 13.4 % (ref 11.5–15.5)
WBC: 10.5 10*3/uL (ref 4.0–10.5)

## 2019-07-08 LAB — HEMOGLOBIN A1C: Hgb A1c MFr Bld: 5.6 % (ref 4.6–6.5)

## 2019-07-08 NOTE — Assessment & Plan Note (Signed)
Continue to monitor at home, goal < 140/90

## 2019-07-08 NOTE — Assessment & Plan Note (Signed)
Also for iron f/u lab

## 2019-07-08 NOTE — Progress Notes (Signed)
Subjective:    Patient ID: Claudia Myers, female    DOB: 1951/04/28, 68 y.o.   MRN: [id number]  HPI  Here for wellness and f/u;  Overall doing ok;  Pt denies Chest pain, worsening SOB, DOE, wheezing, orthopnea, PND, worsening LE edema, palpitations, dizziness or syncope.  Pt denies neurological change such as new headache, facial or extremity weakness.  Pt denies polydipsia, polyuria, or low sugar symptoms. Pt states overall good compliance with treatment and medications, good tolerability, and has been trying to follow appropriate diet.  Pt denies worsening depressive symptoms, suicidal ideation or panic. No fever, night sweats, wt loss, loss of appetite, or other constitutional symptoms.  Pt states good ability with ADL's, has low fall risk, home safety reviewed and adequate, no other significant changes in hearing or vision, and only occasionally active with exercise. BP at home < 140/90.  Has gained several lbs.   Wt Readings from Last 3 Encounters:  07/08/19 172 lb (78 kg)  06/01/18 169 lb (76.7 kg)  03/04/18 166 lb (75.3 kg)  Had cortisone per Dr Alvan Dame in nov 2019, trying to hold off TKR for now.  Has colonsocopy sched per Dr Tresa Moore next month.  Past Medical History:  Diagnosis Date  . HYPERLIPIDEMIA 09/05/2008  . HYPERTENSION 09/05/2008   Past Surgical History:  Procedure Laterality Date  . ABDOMINAL HYSTERECTOMY      reports that she has never smoked. She has never used smokeless tobacco. She reports current alcohol use. She reports that she does not use drugs. family history includes Diabetes in her mother; Hypertension in her mother; Peripheral vascular disease in her mother; Stroke in her mother. No Known Allergies Current Outpatient Medications on File Prior to Visit  Medication Sig Dispense Refill  . amLODipine (NORVASC) 5 MG tablet Take 1 tablet (5 mg total) by mouth daily. 90 tablet 3  . rosuvastatin (CRESTOR) 20 MG tablet TAKE 1 TABLET BY MOUTH EVERY DAY 90 tablet 3   No  current facility-administered medications on file prior to visit.    Review of Systems Constitutional: Negative for other unusual diaphoresis, sweats, appetite or weight changes HENT: Negative for other worsening hearing loss, ear pain, facial swelling, mouth sores or neck stiffness.   Eyes: Negative for other worsening pain, redness or other visual disturbance.  Respiratory: Negative for other stridor or swelling Cardiovascular: Negative for other palpitations or other chest pain  Gastrointestinal: Negative for worsening diarrhea or loose stools, blood in stool, distention or other pain Genitourinary: Negative for hematuria, flank pain or other change in urine volume.  Musculoskeletal: Negative for myalgias or other joint swelling.  Skin: Negative for other color change, or other wound or worsening drainage.  Neurological: Negative for other syncope or numbness. Hematological: Negative for other adenopathy or swelling Psychiatric/Behavioral: Negative for hallucinations, other worsening agitation, SI, self-injury, or new decreased concentration\ All other system neg per pt    Objective:   Physical Exam BP (!) 174/122   Pulse (!) 123   Temp 98.1 F (36.7 C) (Oral)   Ht 5\' 3"  (1.6 m)   Wt 172 lb (78 kg)   SpO2 97%   BMI 30.47 kg/m  VS noted,  Constitutional: Pt is oriented to person, place, and time. Appears well-developed and well-nourished, in no significant distress and comfortable Head: Normocephalic and atraumatic  Eyes: Conjunctivae and EOM are normal. Pupils are equal, round, and reactive to light Right Ear: External ear normal without discharge Left Ear: External ear normal without discharge  Nose: Nose without discharge or deformity Mouth/Throat: Oropharynx is without other ulcerations and moist  Neck: Normal range of motion. Neck supple. No JVD present. No tracheal deviation present or significant neck LA or mass Cardiovascular: Normal rate, regular rhythm, normal heart  sounds and intact distal pulses.   Pulmonary/Chest: WOB normal and breath sounds without rales or wheezing  Abdominal: Soft. Bowel sounds are normal. NT. No HSM  Musculoskeletal: Normal range of motion. Exhibits no edema Lymphadenopathy: Has no other cervical adenopathy.  Neurological: Pt is alert and oriented to person, place, and time. Pt has normal reflexes. No cranial nerve deficit. Motor grossly intact, Gait intact Skin: Skin is warm and dry. No rash noted or new ulcerations Psychiatric:  Has normal mood and affect. Behavior is normal without agitation No other exam findings Lab Results  Component Value Date   WBC 10.0 06/01/2018   HGB 13.7 06/01/2018   HCT 40.2 06/01/2018   PLT 425.0 (H) 06/01/2018   GLUCOSE 101 (H) 06/01/2018   CHOL 321 (H) 06/01/2018   TRIG 224.0 (H) 06/01/2018   HDL 96.00 06/01/2018   LDLDIRECT 181.0 06/01/2018   LDLCALC 96 04/25/2011   ALT 10 06/01/2018   AST 17 06/01/2018   NA 138 06/01/2018   K 3.5 06/01/2018   CL 99 06/01/2018   CREATININE 0.80 06/01/2018   BUN 9 06/01/2018   CO2 28 06/01/2018   TSH 1.05 06/01/2018   HGBA1C 5.7 06/01/2018       Assessment & Plan:

## 2019-07-08 NOTE — Patient Instructions (Addendum)
You had the Tdap tetanus and the Pneumovax pneumonia shots today  Please continue all other medications as before, and refills have been done if requested.  Please have the pharmacy call with any other refills you may need.  Please continue your efforts at being more active, low cholesterol diet, and weight control.  You are otherwise up to date with prevention measures today.  Please keep your appointments with your specialists as you may have planned  Please go to the LAB in the Basement (turn left off the elevator) for the tests to be done today  You will be contacted by phone if any changes need to be made immediately.  Otherwise, you will receive a letter about your results with an explanation, but please check with MyChart first.  Please remember to sign up for MyChart if you have not done so, as this will be important to you in the future with finding out test results, communicating by private email, and scheduling acute appointments online when needed.  Please return in 1 year for your yearly visit, or sooner if needed, with Lab testing done 3-5 days before

## 2019-07-08 NOTE — Assessment & Plan Note (Signed)
stable overall by history and exam, recent data reviewed with pt, and pt to continue medical treatment as before,  to f/u any worsening symptoms or concerns  

## 2019-07-08 NOTE — Assessment & Plan Note (Signed)

## 2019-07-09 ENCOUNTER — Other Ambulatory Visit: Payer: Self-pay | Admitting: Internal Medicine

## 2019-07-09 ENCOUNTER — Telehealth: Payer: Self-pay

## 2019-07-09 ENCOUNTER — Encounter: Payer: Self-pay | Admitting: Internal Medicine

## 2019-07-09 LAB — HEPATIC FUNCTION PANEL
ALT: 18 U/L (ref 0–35)
AST: 26 U/L (ref 0–37)
Albumin: 5 g/dL (ref 3.5–5.2)
Alkaline Phosphatase: 107 U/L (ref 39–117)
Bilirubin, Direct: 0 mg/dL (ref 0.0–0.3)
Total Bilirubin: 0.6 mg/dL (ref 0.2–1.2)
Total Protein: 8.9 g/dL — ABNORMAL HIGH (ref 6.0–8.3)

## 2019-07-09 LAB — VITAMIN B12: Vitamin B-12: 870 pg/mL (ref 211–911)

## 2019-07-09 LAB — TSH: TSH: 1.07 u[IU]/mL (ref 0.35–4.50)

## 2019-07-09 LAB — HEPATITIS C ANTIBODY
Hepatitis C Ab: NONREACTIVE
SIGNAL TO CUT-OFF: 0.04 (ref <1.00)

## 2019-07-09 LAB — IBC PANEL
Iron: 53 ug/dL (ref 42–145)
Saturation Ratios: 13 % — ABNORMAL LOW (ref 20.0–50.0)
Transferrin: 291 mg/dL (ref 212.0–360.0)

## 2019-07-09 LAB — BASIC METABOLIC PANEL
BUN: 7 mg/dL (ref 6–23)
CO2: 28 mEq/L (ref 19–32)
Calcium: 10.4 mg/dL (ref 8.4–10.5)
Chloride: 102 mEq/L (ref 96–112)
Creatinine, Ser: 0.84 mg/dL (ref 0.40–1.20)
GFR: 81.92 mL/min (ref >60.00)
Glucose, Bld: 106 mg/dL — ABNORMAL HIGH (ref 70–99)
Potassium: 4.1 mEq/L (ref 3.5–5.1)
Sodium: 140 mEq/L (ref 135–145)

## 2019-07-09 LAB — LIPID PANEL
Cholesterol: 202 mg/dL — ABNORMAL HIGH (ref 0–200)
HDL: 91.5 mg/dL (ref >39.00)
LDL Cholesterol: 94 mg/dL (ref 0–99)
NonHDL: 110
Total CHOL/HDL Ratio: 2
Triglycerides: 82 mg/dL (ref 0.0–149.0)
VLDL: 16.4 mg/dL (ref 0.0–40.0)

## 2019-07-09 LAB — VITAMIN D 25 HYDROXY (VIT D DEFICIENCY, FRACTURES): VITD: 27.32 ng/mL — ABNORMAL LOW (ref 30.00–100.00)

## 2019-07-09 MED ORDER — VITAMIN D (ERGOCALCIFEROL) 1.25 MG (50000 UNIT) PO CAPS: 50000.0000 [IU] | ORAL_CAPSULE | ORAL | 0 refills | Status: DC

## 2019-07-09 NOTE — Telephone Encounter (Signed)
-----   Message from Biagio Borg, MD sent at 07/09/2019 11:38 AM EDT ----- Letter sent, cont same tx except  The test results show that your current treatment is OK, as the tests are stable, except for a low Vitamin D level. Please take Vitamin D 50000 units weekly for 12 weeks, then plan to change to OTC Vitamin D3 at 2000 units per day, indefinitely.Redmond Baseman to please inform pt, I will do rx

## 2019-07-09 NOTE — Telephone Encounter (Signed)
Pt has been informed of results and expressed understanding.  °

## 2019-07-21 DIAGNOSIS — Z1211 Encounter for screening for malignant neoplasm of colon: Secondary | ICD-10-CM | POA: Diagnosis not present

## 2019-08-08 ENCOUNTER — Other Ambulatory Visit: Payer: Self-pay | Admitting: Internal Medicine

## 2019-08-23 ENCOUNTER — Ambulatory Visit: Payer: Medicare Other

## 2019-09-20 ENCOUNTER — Ambulatory Visit
Admission: RE | Admit: 2019-09-20 | Discharge: 2019-09-20 | Disposition: A | Discharge status: Home / Self Care | Payer: Medicare Other | Source: Ambulatory Visit | Attending: Internal Medicine | Admitting: Internal Medicine

## 2019-09-20 ENCOUNTER — Other Ambulatory Visit: Payer: Self-pay

## 2019-09-20 DIAGNOSIS — Z1231 Encounter for screening mammogram for malignant neoplasm of breast: Secondary | ICD-10-CM

## 2019-09-24 ENCOUNTER — Other Ambulatory Visit: Payer: Self-pay | Admitting: Internal Medicine

## 2019-10-29 ENCOUNTER — Telehealth: Payer: Self-pay

## 2019-10-29 NOTE — Telephone Encounter (Signed)
Pt has been informed to take vit d 2000 units now since she has finished her prescription.     Copied from [place] [id number]. Topic: General - Other >> Oct 28, 2019  2:43 PM Carolyn Stare wrote: Pt call to say she is through with Vitamin D, Ergocalciferol, (DRISDOL) 1.25 MG (50000 UT) CAPS capsule    and she is asking if she need to start taking Vitamin D3 over the counter

## 2019-12-10 HISTORY — PX: OTHER SURGICAL HISTORY: SHX169

## 2020-02-13 ENCOUNTER — Ambulatory Visit: Payer: Medicare Other | Attending: Internal Medicine

## 2020-02-13 DIAGNOSIS — Z23 Encounter for immunization: Secondary | ICD-10-CM | POA: Insufficient documentation

## 2020-02-13 NOTE — Progress Notes (Signed)
   Covid-19 Vaccination Clinic  Name:  JALILA GOODNOUGH    MRN: [id number] DOB: May 01, 1951  02/13/2020  Ms. Alderman was observed post Covid-19 immunization for 15 minutes without incident. She was provided with Vaccine Information Sheet and instruction to access the V-Safe system.   Ms. Vadala was instructed to call 911 with any severe reactions post vaccine: Marland Kitchen Difficulty breathing  . Swelling of face and throat  . A fast heartbeat  . A bad rash all over body  . Dizziness and weakness   Immunizations Administered    Name Date Dose VIS Date Route   Pfizer COVID-19 Vaccine 02/13/2020 10:20 AM 0.3 mL 11/19/2019 Intramuscular   Manufacturer: [employer], [employer]   Lot: [id number]   NDC: [address]-5

## 2020-03-07 ENCOUNTER — Telehealth: Payer: Self-pay | Admitting: Internal Medicine

## 2020-03-07 NOTE — Telephone Encounter (Signed)
    Patient would like to know if she safe to take Centrum Multivitamin Women  50+ along with Vitamin D

## 2020-03-08 NOTE — Telephone Encounter (Signed)
Sounds good, ok to take as per pt

## 2020-03-08 NOTE — Telephone Encounter (Signed)
Pt contacted and informed it was okay per PCP to take the multivitamin and vitamin D as requested.

## 2020-03-15 ENCOUNTER — Ambulatory Visit: Payer: Medicare Other | Attending: Internal Medicine

## 2020-03-15 DIAGNOSIS — Z23 Encounter for immunization: Secondary | ICD-10-CM

## 2020-03-15 NOTE — Progress Notes (Signed)
   Covid-19 Vaccination Clinic  Name:  NEITA LANDRIGAN    MRN: [id number] DOB: November 01, 1951  03/15/2020  Ms. Sessa was observed post Covid-19 immunization for    Covid-19 Vaccination Clinic  Name:  MARYBEL ALCOTT    MRN: [id number] DOB: [birth year]  03/15/2020  Ms. Runions was observed post Covid-19 immunization for 15 minutes without incident. She was provided with Vaccine Information Sheet and instruction to access the V-Safe system.   Ms. Selway was instructed to call 911 with any severe reactions post vaccine: Marland Kitchen Difficulty breathing  . Swelling of face and throat  . A fast heartbeat  . A bad rash all over body  . Dizziness and weakness   Immunizations Administered    Name Date Dose VIS Date Route   Pfizer COVID-19 Vaccine 03/15/2020 10:33 AM 0.3 mL 11/19/2019 Intramuscular   Manufacturer: [employer], [employer]   Lot: [id number]   NDC: [address]-5     without incident. She was provided with Vaccine Information Sheet and instruction to access the V-Safe system.   Ms. Mastrianni was instructed to call 911 with any severe reactions post vaccine: Marland Kitchen Difficulty breathing  . Swelling of face and throat  . A fast heartbeat  . A bad rash all over body  . Dizziness and weakness   Immunizations Administered    Name Date Dose VIS Date Route   Pfizer COVID-19 Vaccine 03/15/2020 10:33 AM 0.3 mL 11/19/2019 Intramuscular   Manufacturer: [employer], [employer]   Lot: [id number]   NDC: [address]-0

## 2020-05-02 ENCOUNTER — Other Ambulatory Visit: Payer: Self-pay | Admitting: Internal Medicine

## 2020-05-16 DIAGNOSIS — H40013 Open angle with borderline findings, low risk, bilateral: Secondary | ICD-10-CM | POA: Diagnosis not present

## 2020-05-16 DIAGNOSIS — H25013 Cortical age-related cataract, bilateral: Secondary | ICD-10-CM | POA: Diagnosis not present

## 2020-05-16 DIAGNOSIS — H2513 Age-related nuclear cataract, bilateral: Secondary | ICD-10-CM | POA: Diagnosis not present

## 2020-05-16 DIAGNOSIS — H25043 Posterior subcapsular polar age-related cataract, bilateral: Secondary | ICD-10-CM | POA: Diagnosis not present

## 2020-05-16 DIAGNOSIS — H2512 Age-related nuclear cataract, left eye: Secondary | ICD-10-CM | POA: Diagnosis not present

## 2020-06-09 DIAGNOSIS — M238X1 Other internal derangements of right knee: Secondary | ICD-10-CM | POA: Diagnosis not present

## 2020-06-09 DIAGNOSIS — M25561 Pain in right knee: Secondary | ICD-10-CM | POA: Diagnosis not present

## 2020-06-26 DIAGNOSIS — H2512 Age-related nuclear cataract, left eye: Secondary | ICD-10-CM | POA: Diagnosis not present

## 2020-06-27 DIAGNOSIS — H2511 Age-related nuclear cataract, right eye: Secondary | ICD-10-CM | POA: Diagnosis not present

## 2020-07-13 ENCOUNTER — Encounter: Payer: Self-pay | Admitting: Internal Medicine

## 2020-07-13 ENCOUNTER — Other Ambulatory Visit: Payer: Self-pay

## 2020-07-13 ENCOUNTER — Other Ambulatory Visit (INDEPENDENT_AMBULATORY_CARE_PROVIDER_SITE_OTHER): Payer: Medicare Other

## 2020-07-13 ENCOUNTER — Ambulatory Visit (INDEPENDENT_AMBULATORY_CARE_PROVIDER_SITE_OTHER): Payer: Medicare Other | Admitting: Internal Medicine

## 2020-07-13 VITALS — BP 158/70 | HR 106 | Temp 98.2°F | Ht 63.0 in | Wt 172.0 lb

## 2020-07-13 DIAGNOSIS — Z0000 Encounter for general adult medical examination without abnormal findings: Secondary | ICD-10-CM

## 2020-07-13 DIAGNOSIS — E559 Vitamin D deficiency, unspecified: Secondary | ICD-10-CM

## 2020-07-13 DIAGNOSIS — R739 Hyperglycemia, unspecified: Secondary | ICD-10-CM

## 2020-07-13 LAB — COMPREHENSIVE METABOLIC PANEL
ALT: 14 U/L (ref 0–35)
AST: 20 U/L (ref 0–37)
Albumin: 4.3 g/dL (ref 3.5–5.2)
Alkaline Phosphatase: 91 U/L (ref 39–117)
BUN: 9 mg/dL (ref 6–23)
CO2: 30 mEq/L (ref 19–32)
Calcium: 9.9 mg/dL (ref 8.4–10.5)
Chloride: 104 mEq/L (ref 96–112)
Creatinine, Ser: 0.78 mg/dL (ref 0.40–1.20)
GFR: 88.96 mL/min (ref >60.00)
Glucose, Bld: 91 mg/dL (ref 70–99)
Potassium: 4.1 mEq/L (ref 3.5–5.1)
Sodium: 140 mEq/L (ref 135–145)
Total Bilirubin: 0.6 mg/dL (ref 0.2–1.2)
Total Protein: 7.6 g/dL (ref 6.0–8.3)

## 2020-07-13 LAB — LIPID PANEL
Cholesterol: 179 mg/dL (ref 0–200)
HDL: 82.8 mg/dL (ref >39.00)
LDL Cholesterol: 81 mg/dL (ref 0–99)
NonHDL: 96.49
Total CHOL/HDL Ratio: 2
Triglycerides: 78 mg/dL (ref 0.0–149.0)
VLDL: 15.6 mg/dL (ref 0.0–40.0)

## 2020-07-13 LAB — CBC WITH DIFFERENTIAL/PLATELET
Basophils Absolute: 0 10*3/uL (ref 0.0–0.1)
Basophils Relative: 0.5 % (ref 0.0–3.0)
Eosinophils Absolute: 0.2 10*3/uL (ref 0.0–0.7)
Eosinophils Relative: 2.6 % (ref 0.0–5.0)
HCT: 38 % (ref 36.0–46.0)
Hemoglobin: 12.5 g/dL (ref 12.0–15.0)
Lymphocytes Relative: 22.5 % (ref 12.0–46.0)
Lymphs Abs: 2 10*3/uL (ref 0.7–4.0)
MCHC: 33 g/dL (ref 30.0–36.0)
MCV: 93.2 fl (ref 78.0–100.0)
Monocytes Absolute: 0.5 10*3/uL (ref 0.1–1.0)
Monocytes Relative: 6 % (ref 3.0–12.0)
Neutro Abs: 6.1 10*3/uL (ref 1.4–7.7)
Neutrophils Relative %: 68.4 % (ref 43.0–77.0)
Platelets: 399 10*3/uL (ref 150.0–400.0)
RBC: 4.07 Mil/uL (ref 3.87–5.11)
RDW: 12.8 % (ref 11.5–15.5)
WBC: 9 10*3/uL (ref 4.0–10.5)

## 2020-07-13 LAB — TSH: TSH: 0.96 u[IU]/mL (ref 0.35–4.50)

## 2020-07-13 LAB — HEMOGLOBIN A1C: Hgb A1c MFr Bld: 5.8 % (ref 4.6–6.5)

## 2020-07-13 LAB — VITAMIN D 25 HYDROXY (VIT D DEFICIENCY, FRACTURES): VITD: 32.31 ng/mL (ref 30.00–100.00)

## 2020-07-13 NOTE — Progress Notes (Signed)
Subjective:    Patient ID: Claudia Myers, female    DOB: [birth year], 69 y.o.   MRN: [id number]  HPI  Here for wellness and f/u;  Overall doing ok;  Pt denies Chest pain, worsening SOB, DOE, wheezing, orthopnea, PND, worsening LE edema, palpitations, dizziness or syncope.  Pt denies neurological change such as new headache, facial or extremity weakness.  Pt denies polydipsia, polyuria, or low sugar symptoms. Pt states overall good compliance with treatment and medications, good tolerability, and has been trying to follow appropriate diet.  Pt denies worsening depressive symptoms, suicidal ideation or panic. No fever, night sweats, wt loss, loss of appetite, or other constitutional symptoms.  Pt states good ability with ADL's, has low fall risk, home safety reviewed and adequate, no other significant changes in hearing or vision, and only occasionally active with exercise. Had Dr Madelin Rear visit july 21 with coritisone to right knee that helped, but still with ongoing right knee pain, needs handicapped parking.  To have bilateral cataract out soon. BP < 140/i0 at home Past Medical History:  Diagnosis Date  . HYPERLIPIDEMIA 09/05/2008  . HYPERTENSION 09/05/2008   Past Surgical History:  Procedure Laterality Date  . ABDOMINAL HYSTERECTOMY      reports that she has never smoked. She has never used smokeless tobacco. She reports current alcohol use. She reports that she does not use drugs. family history includes Diabetes in her mother; Hypertension in her mother; Peripheral vascular disease in her mother; Stroke in her mother. No Known Allergies Current Outpatient Medications on File Prior to Visit  Medication Sig Dispense Refill  . amLODipine (NORVASC) 5 MG tablet TAKE 1 TABLET BY MOUTH EVERY DAY 90 tablet 3  . BESIVANCE 0.6 % SUSP Place 1 drop into the left eye 3 (three) times daily.    . cycloSPORINE (RESTASIS) 0.05 % ophthalmic emulsion Restasis 0.05 % eye drops in a dropperette    . DUREZOL  0.05 % EMUL Place 1 drop into the left eye 3 (three) times daily.    . Influenza vac split quadrivalent PF (FLUZONE HIGH-DOSE) 0.5 ML injection Fluzone High-Dose 2019-20 (PF) 180 mcg/0.5 mL intramuscular syringe  TO BE ADMINISTERED BY PHARMACIST FOR IMMUNIZATION    . PROLENSA 0.07 % SOLN SMARTSIG:1 Drop(s) Left Eye Every Evening    . rosuvastatin (CRESTOR) 20 MG tablet TAKE 1 TABLET BY MOUTH EVERY DAY 90 tablet 0   No current facility-administered medications on file prior to visit.   Review of Systems All otherwise neg per pt    Objective:   Physical Exam BP (!) 158/70 (BP Location: Left Arm, Patient Position: Sitting, Cuff Size: Large)   Pulse (!) 106   Temp 98.2 F (36.8 C) (Oral)   Ht 5\' 3"  (1.6 m)   Wt 172 lb (78 kg)   SpO2 98%   BMI 30.47 kg/m  VS noted,  Constitutional: Pt appears in NAD HENT: Head: NCAT.  Right Ear: External ear normal.  Left Ear: External ear normal.  Eyes: . Pupils are equal, round, and reactive to light. Conjunctivae and EOM are normal Nose: without d/c or deformity Neck: Neck supple. Gross normal ROM Cardiovascular: Normal rate and regular rhythm.   Pulmonary/Chest: Effort normal and breath sounds without rales or wheezing.  Abd:  Soft, NT, ND, + BS, no organomegaly Neurological: Pt is alert. At baseline orientation, motor grossly intact Skin: Skin is warm. No rashes, other new lesions, no LE edema Psychiatric: Pt behavior is normal without agitation  All otherwise  neg per pt Lab Results  Component Value Date   WBC 9.0 07/13/2020   HGB 12.5 07/13/2020   HCT 38.0 07/13/2020   PLT 399.0 07/13/2020   GLUCOSE 91 07/13/2020   CHOL 179 07/13/2020   TRIG 78.0 07/13/2020   HDL 82.80 07/13/2020   LDLDIRECT 181.0 06/01/2018   LDLCALC 81 07/13/2020   ALT 14 07/13/2020   AST 20 07/13/2020   NA 140 07/13/2020   K 4.1 07/13/2020   CL 104 07/13/2020   CREATININE 0.78 07/13/2020   BUN 9 07/13/2020   CO2 30 07/13/2020   TSH 0.96 07/13/2020    HGBA1C 5.8 07/13/2020      Assessment & Plan:

## 2020-07-13 NOTE — Assessment & Plan Note (Signed)
For oral replacement 

## 2020-07-13 NOTE — Patient Instructions (Signed)
Please take OTC Vitamin D3 at 2000 units per day, indefinitely  Please continue all other medications as before, and refills have been done if requested.  Please have the pharmacy call with any other refills you may need.  Please continue your efforts at being more active, low cholesterol diet, and weight control.  You are otherwise up to date with prevention measures today.  Please keep your appointments with your specialists as you may have planned  Please go to the LAB at the blood drawing area for the tests to be done  You will be contacted by phone if any changes need to be made immediately.  Otherwise, you will receive a letter about your results with an explanation, but please check with MyChart first.  Please remember to sign up for MyChart if you have not done so, as this will be important to you in the future with finding out test results, communicating by private email, and scheduling acute appointments online when needed.  Please make an Appointment to return for your 1 year visit, or sooner if needed 

## 2020-07-13 NOTE — Assessment & Plan Note (Signed)

## 2020-07-13 NOTE — Assessment & Plan Note (Signed)
stable overall by history and exam, recent data reviewed with pt, and pt to continue medical treatment as before,  to f/u any worsening symptoms or concerns  

## 2020-07-14 ENCOUNTER — Other Ambulatory Visit: Payer: Self-pay | Admitting: Internal Medicine

## 2020-07-14 ENCOUNTER — Encounter: Payer: Self-pay | Admitting: Internal Medicine

## 2020-07-14 NOTE — Telephone Encounter (Signed)
Please refill as per office routine med refill policy (all routine meds refilled for 3 mo or monthly per pt preference up to one year from last visit, then month to month grace period for 3 mo, then further med refills will have to be denied)  

## 2020-07-17 DIAGNOSIS — H2511 Age-related nuclear cataract, right eye: Secondary | ICD-10-CM | POA: Diagnosis not present

## 2020-08-16 ENCOUNTER — Other Ambulatory Visit: Payer: Self-pay | Admitting: Internal Medicine

## 2020-08-16 DIAGNOSIS — Z1231 Encounter for screening mammogram for malignant neoplasm of breast: Secondary | ICD-10-CM

## 2020-08-21 DIAGNOSIS — M238X1 Other internal derangements of right knee: Secondary | ICD-10-CM | POA: Diagnosis not present

## 2020-08-21 DIAGNOSIS — M1711 Unilateral primary osteoarthritis, right knee: Secondary | ICD-10-CM | POA: Diagnosis not present

## 2020-09-21 ENCOUNTER — Ambulatory Visit
Admission: RE | Admit: 2020-09-21 | Discharge: 2020-09-21 | Disposition: A | Discharge status: Home / Self Care | Payer: Medicare Other | Source: Ambulatory Visit | Attending: Internal Medicine | Admitting: Internal Medicine

## 2020-09-21 ENCOUNTER — Other Ambulatory Visit: Payer: Self-pay

## 2020-09-21 DIAGNOSIS — Z1231 Encounter for screening mammogram for malignant neoplasm of breast: Secondary | ICD-10-CM | POA: Diagnosis not present

## 2020-10-10 ENCOUNTER — Other Ambulatory Visit: Payer: Self-pay | Admitting: Internal Medicine

## 2020-10-10 NOTE — Telephone Encounter (Signed)
,  Please refill as per office routine med refill policy (all routine meds refilled for 3 mo or monthly per pt preference up to one year from last visit, then month to month grace period for 3 mo, then further med refills will have to be denied)  

## 2021-01-07 ENCOUNTER — Other Ambulatory Visit: Payer: Self-pay | Admitting: Internal Medicine

## 2021-01-07 NOTE — Telephone Encounter (Signed)
Please refill as per office routine med refill policy (all routine meds refilled for 3 mo or monthly per pt preference up to one year from last visit, then month to month grace period for 3 mo, then further med refills will have to be denied)  

## 2021-04-09 ENCOUNTER — Other Ambulatory Visit: Payer: Self-pay | Admitting: Internal Medicine

## 2021-04-09 NOTE — Telephone Encounter (Signed)
Please refill as per office routine med refill policy (all routine meds refilled for 3 mo or monthly per pt preference up to one year from last visit, then month to month grace period for 3 mo, then further med refills will have to be denied)  

## 2021-04-21 ENCOUNTER — Other Ambulatory Visit: Payer: Self-pay | Admitting: Internal Medicine

## 2021-04-21 NOTE — Telephone Encounter (Signed)
Please refill as per office routine med refill policy (all routine meds refilled for 3 mo or monthly per pt preference up to one year from last visit, then month to month grace period for 3 mo, then further med refills will have to be denied)  

## 2021-05-18 ENCOUNTER — Other Ambulatory Visit: Payer: Self-pay | Admitting: Internal Medicine

## 2021-05-19 ENCOUNTER — Other Ambulatory Visit: Payer: Self-pay | Admitting: Internal Medicine

## 2021-05-19 NOTE — Telephone Encounter (Signed)
Please refill as per office routine med refill policy (all routine meds refilled for 3 mo or monthly per pt preference up to one year from last visit, then month to month grace period for 3 mo, then further med refills will have to be denied)  

## 2021-08-16 ENCOUNTER — Telehealth: Payer: Self-pay | Admitting: Internal Medicine

## 2021-08-16 NOTE — Telephone Encounter (Signed)
Left message for patient to call me back at [phone] to schedule Medicare Annual Wellness Visit   No hx of AWV eligible as of 11/08/18  Please schedule at anytime with [facility] Advisor if patient calls the office back.    40 Minutes appointment   Any questions, please call me at [phone]

## 2021-08-20 ENCOUNTER — Other Ambulatory Visit: Payer: Self-pay | Admitting: Internal Medicine

## 2021-08-20 DIAGNOSIS — Z1231 Encounter for screening mammogram for malignant neoplasm of breast: Secondary | ICD-10-CM

## 2021-08-20 NOTE — Telephone Encounter (Signed)
Please refill as per office routine med refill policy (all routine meds to be refilled for 3 mo or monthly (per pt preference) up to one year from last visit, then month to month grace period for 3 mo, then further med refills will have to be denied) ? ?

## 2021-08-30 ENCOUNTER — Ambulatory Visit (INDEPENDENT_AMBULATORY_CARE_PROVIDER_SITE_OTHER): Payer: 59

## 2021-08-30 ENCOUNTER — Other Ambulatory Visit: Payer: Self-pay

## 2021-08-30 VITALS — Ht 63.0 in | Wt 170.0 lb

## 2021-08-30 DIAGNOSIS — Z0000 Encounter for general adult medical examination without abnormal findings: Secondary | ICD-10-CM

## 2021-08-30 NOTE — Patient Instructions (Signed)
Claudia Myers , Thank you for taking time to come for your Medicare Wellness Visit. I appreciate your ongoing commitment to your health goals. Please review the following plan we discussed and let me know if I can assist you in the future.   Screening recommendations/referrals: Colonoscopy: 07/21/2019; due every 10 years Claudia Elizabeth, MD.) Mammogram: 09/21/2020; due every year (scheduled for 09/24/2021) Bone Density: never done Recommended yearly ophthalmology/optometry visit for glaucoma screening and checkup Recommended yearly dental visit for hygiene and checkup  Vaccinations: Influenza vaccine: 08/21/2021 Pneumococcal vaccine: 06/01/2018, 07/08/2019 Tdap vaccine: 07/08/2019; due every 10 years Shingles vaccine: 09/10/2019, 12/19/2019   Covid-19: 02/13/2020, 03/15/2020, 10/16/2020, 04/05/2021  Advanced directives: Advance directive discussed with you today. I have provided a copy for you to complete at home and have notarized. Once this is complete please bring a copy in to our office so we can scan it into your chart.  Conditions/risks identified: Yes; Client understands the importance of follow-up with providers by attending scheduled visits and discussed goals to eat healthier, increase physical activity, exercise the brain, socialize more, get enough sleep and make time for laughter.  Next appointment: 09/02/2022 at 10:00 am Phone Visit with Nurse Health Advisor.   Preventive Care 66 Years and Older, Female Preventive care refers to lifestyle choices and visits with your health care provider that can promote health and wellness. What does preventive care include? A yearly physical exam. This is also called an annual well check. Dental exams once or twice a year. Routine eye exams. Ask your health care provider how often you should have your eyes checked. Personal lifestyle choices, including: Daily care of your teeth and gums. Regular physical activity. Eating a healthy diet. Avoiding tobacco  and drug use. Limiting alcohol use. Practicing safe sex. Taking low-dose aspirin every day. Taking vitamin and mineral supplements as recommended by your health care provider. What happens during an annual well check? The services and screenings done by your health care provider during your annual well check will depend on your age, overall health, lifestyle risk factors, and family history of disease. Counseling  Your health care provider may ask you questions about your: Alcohol use. Tobacco use. Drug use. Emotional well-being. Home and relationship well-being. Sexual activity. Eating habits. History of falls. Memory and ability to understand (cognition). Work and work Astronomer. Reproductive health. Screening  You may have the following tests or measurements: Height, weight, and BMI. Blood pressure. Lipid and cholesterol levels. These may be checked every 5 years, or more frequently if you are over 44 years old. Skin check. Lung cancer screening. You may have this screening every year starting at age 30 if you have a 30-pack-year history of smoking and currently smoke or have quit within the past 15 years. Fecal occult blood test (FOBT) of the stool. You may have this test every year starting at age 73. Flexible sigmoidoscopy or colonoscopy. You may have a sigmoidoscopy every 5 years or a colonoscopy every 10 years starting at age 70. Hepatitis C blood test. Hepatitis B blood test. Sexually transmitted disease (STD) testing. Diabetes screening. This is done by checking your blood sugar (glucose) after you have not eaten for a while (fasting). You may have this done every 1-3 years. Bone density scan. This is done to screen for osteoporosis. You may have this done starting at age 69. Mammogram. This may be done every 1-2 years. Talk to your health care provider about how often you should have regular mammograms. Talk with your health  care provider about your test results,  treatment options, and if necessary, the need for more tests. Vaccines  Your health care provider may recommend certain vaccines, such as: Influenza vaccine. This is recommended every year. Tetanus, diphtheria, and acellular pertussis (Tdap, Td) vaccine. You may need a Td booster every 10 years. Zoster vaccine. You may need this after age 72. Pneumococcal 13-valent conjugate (PCV13) vaccine. One dose is recommended after age 56. Pneumococcal polysaccharide (PPSV23) vaccine. One dose is recommended after age 47. Talk to your health care provider about which screenings and vaccines you need and how often you need them. This information is not intended to replace advice given to you by your health care provider. Make sure you discuss any questions you have with your health care provider. Document Released: 12/22/2015 Document Revised: 08/14/2016 Document Reviewed: 09/26/2015 Elsevier Interactive Patient Education  2017 [place] Prevention in the Home Falls can cause injuries. They can happen to people of all ages. There are many things you can do to make your home safe and to help prevent falls. What can I do on the outside of my home? Regularly fix the edges of walkways and driveways and fix any cracks. Remove anything that might make you trip as you walk through a door, such as a raised step or threshold. Trim any bushes or trees on the path to your home. Use bright outdoor lighting. Clear any walking paths of anything that might make someone trip, such as rocks or tools. Regularly check to see if handrails are loose or broken. Make sure that both sides of any steps have handrails. Any raised decks and porches should have guardrails on the edges. Have any leaves, snow, or ice cleared regularly. Use sand or salt on walking paths during winter. Clean up any spills in your garage right away. This includes oil or grease spills. What can I do in the bathroom? Use night  lights. Install grab bars by the toilet and in the tub and shower. Do not use towel bars as grab bars. Use non-skid mats or decals in the tub or shower. If you need to sit down in the shower, use a plastic, non-slip stool. Keep the floor dry. Clean up any water that spills on the floor as soon as it happens. Remove soap buildup in the tub or shower regularly. Attach bath mats securely with double-sided non-slip rug tape. Do not have throw rugs and other things on the floor that can make you trip. What can I do in the bedroom? Use night lights. Make sure that you have a light by your bed that is easy to reach. Do not use any sheets or blankets that are too big for your bed. They should not hang down onto the floor. Have a firm chair that has side arms. You can use this for support while you get dressed. Do not have throw rugs and other things on the floor that can make you trip. What can I do in the kitchen? Clean up any spills right away. Avoid walking on wet floors. Keep items that you use a lot in easy-to-reach places. If you need to reach something above you, use a strong step stool that has a grab bar. Keep electrical cords out of the way. Do not use floor polish or wax that makes floors slippery. If you must use wax, use non-skid floor wax. Do not have throw rugs and other things on the floor that can make you trip. What can  I do with my stairs? Do not leave any items on the stairs. Make sure that there are handrails on both sides of the stairs and use them. Fix handrails that are broken or loose. Make sure that handrails are as long as the stairways. Check any carpeting to make sure that it is firmly attached to the stairs. Fix any carpet that is loose or worn. Avoid having throw rugs at the top or bottom of the stairs. If you do have throw rugs, attach them to the floor with carpet tape. Make sure that you have a light switch at the top of the stairs and the bottom of the stairs. If  you do not have them, ask someone to add them for you. What else can I do to help prevent falls? Wear shoes that: Do not have high heels. Have rubber bottoms. Are comfortable and fit you well. Are closed at the toe. Do not wear sandals. If you use a stepladder: Make sure that it is fully opened. Do not climb a closed stepladder. Make sure that both sides of the stepladder are locked into place. Ask someone to hold it for you, if possible. Clearly mark and make sure that you can see: Any grab bars or handrails. First and last steps. Where the edge of each step is. Use tools that help you move around (mobility aids) if they are needed. These include: Canes. Walkers. Scooters. Crutches. Turn on the lights when you go into a dark area. Replace any light bulbs as soon as they burn out. Set up your furniture so you have a clear path. Avoid moving your furniture around. If any of your floors are uneven, fix them. If there are any pets around you, be aware of where they are. Review your medicines with your doctor. Some medicines can make you feel dizzy. This can increase your chance of falling. Ask your doctor what other things that you can do to help prevent falls. This information is not intended to replace advice given to you by your health care provider. Make sure you discuss any questions you have with your health care provider. Document Released: 09/21/2009 Document Revised: 05/02/2016 Document Reviewed: 12/30/2014 Elsevier Interactive Patient Education  2017 [employer].

## 2021-08-30 NOTE — Progress Notes (Signed)
Patient ID: Claudia Myers, female   DOB: [birth year], 70 y.o.   MRN: [id number] Medical screening examination/treatment/procedure(s) were performed by non-physician practitioner and as supervising physician I was immediately available for consultation/collaboration.  I agree with above. Oliver Barre, MD

## 2021-08-30 NOTE — Progress Notes (Signed)
I connected with Claudia Myers today by telephone and verified that I am speaking with the correct person using two identifiers. Location patient: home Location provider: work Persons participating in the virtual visit: patient, provider.   I discussed the limitations, risks, security and privacy concerns of performing an evaluation and management service by telephone and the availability of in person appointments. I also discussed with the patient that there may be a patient responsible charge related to this service. The patient expressed understanding and verbally consented to this telephonic visit.    Interactive audio and video telecommunications were attempted between this provider and patient, however failed, due to patient having technical difficulties OR patient did not have access to video capability.  We continued and completed visit with audio only.  Some vital signs may be absent or patient reported.   Time Spent with patient on telephone encounter: 40 minutes  Subjective:   Claudia Myers is a 70 y.o. female who presents for an Initial Medicare Annual Wellness Visit.  Review of Systems     Cardiac Risk Factors include: advanced age (>51men, >13 women);dyslipidemia;family history of premature cardiovascular disease;hypertension     Objective:    Today's Vitals   08/30/21 1017  Weight: 170 lb (77.1 kg)  Height: 5\' 3"  (1.6 m)   Body mass index is 30.11 kg/m.  Advanced Directives 08/30/2021 10/24/2016  Does Patient Have a Medical Advance Directive? No No  Would patient like information on creating a medical advance directive? Yes (MAU/Ambulatory/Procedural Areas - Information given) -    Current Medications (verified) Outpatient Encounter Medications as of 08/30/2021  Medication Sig   amLODipine (NORVASC) 5 MG tablet TAKE 1 TABLET BY MOUTH EVERY DAY   BESIVANCE 0.6 % SUSP Place 1 drop into the left eye 3 (three) times daily.   cycloSPORINE (RESTASIS) 0.05  % ophthalmic emulsion Restasis 0.05 % eye drops in a dropperette   DUREZOL 0.05 % EMUL Place 1 drop into the left eye 3 (three) times daily.   PROLENSA 0.07 % SOLN SMARTSIG:1 Drop(s) Left Eye Every Evening   rosuvastatin (CRESTOR) 20 MG tablet TAKE 1 TABLET BY MOUTH EVERY DAY   Influenza vac split quadrivalent PF (FLUZONE HIGH-DOSE) 0.5 ML injection Fluzone High-Dose 2019-20 (PF) 180 mcg/0.5 mL intramuscular syringe  TO BE ADMINISTERED BY PHARMACIST FOR IMMUNIZATION   No facility-administered encounter medications on file as of 08/30/2021.    Allergies (verified) Patient has no known allergies.   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  Tobacco Use   Smoking status: Never   Smokeless tobacco: Never  Substance and Sexual Activity   Alcohol use: Yes   Drug use: No   Sexual activity: Not on file  Other Topics Concern   Not on file  Social History Narrative   Not on file  Social Determinants of Health   Financial Resource Strain: Low Risk    Difficulty of Paying Living Expenses: Not hard at all  Food Insecurity: No Food Insecurity   Worried About Programme researcher, broadcasting/film/video in the Last Year: Never true   Ran Out of Food in the Last Year: Never true  Transportation Needs: No Transportation Needs   Lack of Transportation (Medical): No   Lack of Transportation (Non-Medical): No  Physical Activity: Sufficiently  Active   Days of Exercise per Week: 5 days   Minutes of Exercise per Session: 30 min  Stress: No Stress Concern Present   Feeling of Stress : Not at all  Social Connections: Moderately Integrated   Frequency of Communication with Friends and Family: More than three times a week   Frequency of Social Gatherings with Friends and Family: Once a week   Attends Religious Services: 1 to 4 times per year   Active Member of [employer] or Organizations: No   Attends Banker Meetings: 1 to 4 times per year   Marital Status: Widowed    Tobacco Counseling Counseling given: Not Answered   Clinical Intake:  Pre-visit preparation completed: Yes  Pain : No/denies pain     Diabetes: No  How often do you need to have someone help you when you read instructions, pamphlets, or other written materials from your doctor or pharmacy?: 1 - Never What is the last grade level you completed in school?: HSG  Diabetic? no  Interpreter Needed?: No  Information entered by :: Susie Cassette, LPN   Activities of Daily Living In your present state of health, do you have any difficulty performing the following activities: 08/30/2021  Hearing? N  Vision? N  Difficulty concentrating or making decisions? N  Walking or climbing stairs? N  Dressing or bathing? N  Doing errands, shopping? N  Preparing Food and eating ? N  Using the Toilet? N  In the past six months, have you accidently leaked urine? N  Do you have problems with loss of bowel control? N  Managing your Medications? N  Managing your Finances? N  Housekeeping or managing your Housekeeping? N  Some recent data might be hidden    Patient Care Team: Corwin Levins, MD as PCP - General [name], MD as Consulting Physician (Gastroenterology) Mia Creek, MD as Consulting Physician (Ophthalmology) Durene Romans, MD as Consulting Physician (Orthopedic Surgery)  Indicate any recent Medical Services you may have received from  other than [facility] providers in the past year (date may be approximate).     Assessment:   This is a routine wellness examination for Claudia Myers.  Hearing/Vision screen Hearing Screening - Comments:: Patient denied any hearing difficulty.   No hearing aids.  Vision Screening - Comments:: Patient wears corrective glasses. Eye exam done annually by: Mia Creek, MD.  Dietary issues and exercise activities discussed: Current Exercise Habits: Home exercise routine, Type of exercise: walking;Other - see comments (cubii, stationary bike), Time (Minutes): 30, Frequency (Times/Week): 5, Weekly Exercise (Minutes/Week): 150, Intensity: Moderate, Exercise limited by: None identified   Goals Addressed   None   Depression Screen PHQ 2/9 Scores 08/30/2021 07/13/2020 07/08/2019 06/01/2018 03/04/2018 11/13/2016  PHQ - 2 Score 0 0 0 0 0 0    Fall Risk Fall Risk  08/30/2021 07/13/2020 07/08/2019 06/01/2018 03/04/2018  Falls in the past year? 0 0 0 No No  Number falls in past yr: 0 0 - - -  Injury with Fall? 0 0 - - -  Risk for fall due to : No Fall Risks No Fall Risks - - -  Follow up Falls evaluation completed Falls evaluation completed - - -    FALL RISK PREVENTION PERTAINING TO THE HOME:  Any stairs in or around the home? Yes  If so, are there any without handrails? No  Home free of loose throw rugs in walkways, pet beds, electrical cords, etc? Yes  Adequate lighting in your home to reduce risk of falls? Yes   ASSISTIVE DEVICES UTILIZED TO PREVENT FALLS:  Life alert? Yes  Use of a cane, walker or w/c? No  Grab bars in the bathroom? Yes  Shower chair or bench in shower? No  Elevated toilet seat or a handicapped toilet? Yes   TIMED UP AND GO:  Was the test performed? No .  Length of time to ambulate 10 feet: n/a sec.   Gait steady and fast without use of assistive device (per patient)  Cognitive Function: Normal cognitive status assessed by direct observation by this Nurse Health Advisor. No  abnormalities found.          Immunizations Immunization History  Administered Date(s) Administered   Influenza Whole 10/12/2008   Influenza,inj,Quad PF,6+ Mos 11/13/2016   Influenza-Unspecified 09/08/2018, 08/21/2021   PFIZER(Purple Top)SARS-COV-2 Vaccination 02/13/2020, 03/15/2020, 10/16/2020, 04/05/2021   Pneumococcal Conjugate-13 06/01/2018   Pneumococcal Polysaccharide-23 07/08/2019   Td 07/01/1997   Tdap 07/08/2019    TDAP status: Up to date  Flu Vaccine status: Up to date  Pneumococcal vaccine status: Up to date  Covid-19 vaccine status: Completed vaccines  Qualifies for Shingles Vaccine? Yes   Zostavax completed Yes   Shingrix Completed?: Yes  Screening Tests Health Maintenance  Topic Date Due   DEXA SCAN  Never done   COVID-19 Vaccine (5 - Booster for Pfizer series) 08/05/2021   MAMMOGRAM  09/21/2022   TETANUS/TDAP  07/07/2029   COLONOSCOPY (Pts 45-31yrs Insurance coverage will need to be confirmed)  07/20/2029   INFLUENZA VACCINE  Completed   Hepatitis C Screening  Completed   Zoster Vaccines- Shingrix  Completed   HPV VACCINES  Aged Out    Health Maintenance  Health Maintenance Due  Topic Date Due   DEXA SCAN  Never done   COVID-19 Vaccine (5 - Booster for Pfizer series) 08/05/2021    Colorectal cancer screening: Type of screening: Colonoscopy. Completed 07/21/2019. Repeat every 10 years  Mammogram status: Completed 09/21/2020. Repeat every year (scheduled for 09/24/2021)  Bone density status: never done  Lung Cancer Screening: (Low Dose CT Chest recommended if Age 56-80 years, 30 pack-year currently smoking OR have quit w/in 15years.) does not qualify.   Lung Cancer Screening Referral: no  Additional Screening:  Hepatitis C Screening: does qualify; Completed: yes  Vision Screening: Recommended annual ophthalmology exams for early detection of glaucoma and other disorders of the eye. Is the patient up to date with their annual eye exam?   Yes  Who is the provider or what is the name of the office in which the patient attends annual eye exams? Mia Creek, MD. If pt is not established with a provider, would they like to be referred to a provider to establish care? No .   Dental Screening: Recommended annual dental exams for proper oral hygiene  Community Resource Referral / Chronic Care Management: CRR required this visit?  No   CCM required this visit?  No      Plan:     I have personally reviewed and noted the following  in the patient's chart:   Medical and social history Use of alcohol, tobacco or illicit drugs  Current medications and supplements including opioid prescriptions. Patient is not currently taking opioid prescriptions. Functional ability and status Nutritional status Physical activity Advanced directives List of other physicians Hospitalizations, surgeries, and ER visits in previous 12 months Vitals Screenings to include cognitive, depression, and falls Referrals and appointments  In addition, I have reviewed and discussed with patient certain preventive protocols, quality metrics, and best practice recommendations. A written personalized care plan for preventive services as well as general preventive health recommendations were provided to patient.     Mickeal Needy, LPN   [id number]/[id number]   Nurse Notes:  Patient is cogitatively intact. There were no vitals filed for this visit. Weight was provided by patient. Patient stated that she has no issues with gait or balance; does not use any assistive devices. Hearing Screening - Comments:: Patient denied any hearing difficulty.   No hearing aids.  Vision Screening - Comments:: Patient wears corrective glasses. Eye exam done annually by: Mia Creek, MD.

## 2021-09-24 ENCOUNTER — Ambulatory Visit
Admission: RE | Admit: 2021-09-24 | Discharge: 2021-09-24 | Disposition: A | Discharge status: Home / Self Care | Payer: 59 | Source: Ambulatory Visit | Attending: Internal Medicine | Admitting: Internal Medicine

## 2021-09-24 DIAGNOSIS — Z1231 Encounter for screening mammogram for malignant neoplasm of breast: Secondary | ICD-10-CM

## 2021-10-01 ENCOUNTER — Ambulatory Visit: Payer: 59

## 2021-10-01 ENCOUNTER — Ambulatory Visit
Admission: RE | Admit: 2021-10-01 | Discharge: 2021-10-01 | Disposition: A | Discharge status: Home / Self Care | Payer: 59 | Source: Ambulatory Visit | Attending: Internal Medicine | Admitting: Internal Medicine

## 2021-10-01 ENCOUNTER — Other Ambulatory Visit: Payer: Self-pay

## 2021-10-01 ENCOUNTER — Other Ambulatory Visit: Payer: Self-pay | Admitting: Internal Medicine

## 2021-10-01 DIAGNOSIS — R928 Other abnormal and inconclusive findings on diagnostic imaging of breast: Secondary | ICD-10-CM

## 2021-10-15 ENCOUNTER — Telehealth: Payer: Self-pay | Admitting: Internal Medicine

## 2021-10-15 NOTE — Telephone Encounter (Signed)
Pt. Called and states she has picked up prescription for rosuvastatin (CRESTOR) 20 MG tablet from pharmacy. States she just wanted to update provider, as pharmaacy told her she didn't have authorization to pick any up.    Callback #- [id number]

## 2021-10-16 NOTE — Telephone Encounter (Signed)
Left message for patient to call back and schedule appt with Dr. Jonny Ruiz as she is due for yearly.

## 2021-10-26 ENCOUNTER — Other Ambulatory Visit: Payer: Self-pay

## 2021-10-26 ENCOUNTER — Encounter: Payer: Self-pay | Admitting: Internal Medicine

## 2021-10-26 ENCOUNTER — Ambulatory Visit (INDEPENDENT_AMBULATORY_CARE_PROVIDER_SITE_OTHER): Payer: 59 | Admitting: Internal Medicine

## 2021-10-26 VITALS — BP 146/88 | HR 96 | Temp 98.0°F | Ht 63.0 in | Wt 178.0 lb

## 2021-10-26 DIAGNOSIS — R739 Hyperglycemia, unspecified: Secondary | ICD-10-CM | POA: Diagnosis not present

## 2021-10-26 DIAGNOSIS — E538 Deficiency of other specified B group vitamins: Secondary | ICD-10-CM

## 2021-10-26 DIAGNOSIS — E559 Vitamin D deficiency, unspecified: Secondary | ICD-10-CM

## 2021-10-26 DIAGNOSIS — I10 Essential (primary) hypertension: Secondary | ICD-10-CM

## 2021-10-26 DIAGNOSIS — D509 Iron deficiency anemia, unspecified: Secondary | ICD-10-CM

## 2021-10-26 DIAGNOSIS — E7800 Pure hypercholesterolemia, unspecified: Secondary | ICD-10-CM | POA: Diagnosis not present

## 2021-10-26 DIAGNOSIS — Z0001 Encounter for general adult medical examination with abnormal findings: Secondary | ICD-10-CM

## 2021-10-26 LAB — CBC WITH DIFFERENTIAL/PLATELET
Basophils Absolute: 0 10*3/uL (ref 0.0–0.1)
Basophils Relative: 0.4 % (ref 0.0–3.0)
Eosinophils Absolute: 0.1 10*3/uL (ref 0.0–0.7)
Eosinophils Relative: 1.3 % (ref 0.0–5.0)
HCT: 39 % (ref 36.0–46.0)
Hemoglobin: 12.8 g/dL (ref 12.0–15.0)
Lymphocytes Relative: 22.8 % (ref 12.0–46.0)
Lymphs Abs: 2 10*3/uL (ref 0.7–4.0)
MCHC: 32.8 g/dL (ref 30.0–36.0)
MCV: 92.7 fl (ref 78.0–100.0)
Monocytes Absolute: 0.5 10*3/uL (ref 0.1–1.0)
Monocytes Relative: 6 % (ref 3.0–12.0)
Neutro Abs: 6.1 10*3/uL (ref 1.4–7.7)
Neutrophils Relative %: 69.5 % (ref 43.0–77.0)
Platelets: 401 10*3/uL — ABNORMAL HIGH (ref 150.0–400.0)
RBC: 4.21 Mil/uL (ref 3.87–5.11)
RDW: 13.5 % (ref 11.5–15.5)
WBC: 8.8 10*3/uL (ref 4.0–10.5)

## 2021-10-26 LAB — LIPID PANEL
Cholesterol: 200 mg/dL (ref 0–200)
HDL: 90.9 mg/dL (ref >39.00)
LDL Cholesterol: 88 mg/dL (ref 0–99)
NonHDL: 108.71
Total CHOL/HDL Ratio: 2
Triglycerides: 105 mg/dL (ref 0.0–149.0)
VLDL: 21 mg/dL (ref 0.0–40.0)

## 2021-10-26 LAB — URINALYSIS, ROUTINE W REFLEX MICROSCOPIC
Bilirubin Urine: NEGATIVE
Hgb urine dipstick: NEGATIVE
Ketones, ur: NEGATIVE
Leukocytes,Ua: NEGATIVE
Nitrite: NEGATIVE
Specific Gravity, Urine: 1.015 (ref 1.000–1.030)
Total Protein, Urine: NEGATIVE
Urine Glucose: NEGATIVE
Urobilinogen, UA: 0.2 (ref 0.0–1.0)
pH: 7.5 (ref 5.0–8.0)

## 2021-10-26 LAB — HEPATIC FUNCTION PANEL
ALT: 22 U/L (ref 0–35)
AST: 25 U/L (ref 0–37)
Albumin: 4.6 g/dL (ref 3.5–5.2)
Alkaline Phosphatase: 95 U/L (ref 39–117)
Bilirubin, Direct: 0.1 mg/dL (ref 0.0–0.3)
Total Bilirubin: 0.6 mg/dL (ref 0.2–1.2)
Total Protein: 8 g/dL (ref 6.0–8.3)

## 2021-10-26 LAB — IBC PANEL
Iron: 60 ug/dL (ref 42–145)
Saturation Ratios: 16.9 % — ABNORMAL LOW (ref 20.0–50.0)
TIBC: 355.6 ug/dL (ref 250.0–450.0)
Transferrin: 254 mg/dL (ref 212.0–360.0)

## 2021-10-26 LAB — BASIC METABOLIC PANEL
BUN: 5 mg/dL — ABNORMAL LOW (ref 6–23)
CO2: 28 mEq/L (ref 19–32)
Calcium: 10.1 mg/dL (ref 8.4–10.5)
Chloride: 103 mEq/L (ref 96–112)
Creatinine, Ser: 0.68 mg/dL (ref 0.40–1.20)
GFR: 89.16 mL/min (ref >60.00)
Glucose, Bld: 93 mg/dL (ref 70–99)
Potassium: 3.5 mEq/L (ref 3.5–5.1)
Sodium: 140 mEq/L (ref 135–145)

## 2021-10-26 LAB — HEMOGLOBIN A1C: Hgb A1c MFr Bld: 5.8 % (ref 4.6–6.5)

## 2021-10-26 MED ORDER — ROSUVASTATIN CALCIUM 20 MG PO TABS: 20.0000 mg | ORAL_TABLET | Freq: Every day | ORAL | 3 refills | Status: DC

## 2021-10-26 MED ORDER — AMLODIPINE BESYLATE 5 MG PO TABS: 5.0000 mg | ORAL_TABLET | Freq: Every day | ORAL | 3 refills | Status: DC

## 2021-10-26 NOTE — Progress Notes (Signed)
Patient ID: Claudia Myers, female   DOB: 01/05/1951, 70 y.o.   MRN: [id number]         Chief Complaint:: wellness exam and low vit d, hld, htn , low iron, hyperglycemia       HPI:  Claudia Myers is a 70 y.o. female here for wellness exam; declines covid booster and dxa, o/w up to date                           Also Pt denies chest pain, increased sob or doe, wheezing, orthopnea, PND, increased LE swelling, palpitations, dizziness or syncope.   Pt denies polydipsia, polyuria, or new focal neuro s/s.   Pt denies fever, wt loss, night sweats, loss of appetite, or other constitutional symptoms  Not taking Vit d.  Trying to work on lower chol diet.    BP has been ok < 140/90 at home, does not think she needs further med tx.  No other new complaints   Wt Readings from Last 3 Encounters:  10/26/21 178 lb (80.7 kg)  08/30/21 170 lb (77.1 kg)  07/13/20 172 lb (78 kg)   BP Readings from Last 3 Encounters:  10/26/21 (!) 146/88  07/13/20 (!) 158/70  07/08/19 (!) 174/122   Immunization History  Administered Date(s) Administered   Fluad Quad(high Dose 65+) 08/19/2019   Influenza Inj Mdck Quad Pf 10/06/2017   Influenza Whole 10/12/2008   Influenza, High Dose Seasonal PF 09/17/2018, 08/15/2020, 08/21/2021   Influenza,inj,Quad PF,6+ Mos 11/13/2016   Influenza-Unspecified 09/08/2018, 08/21/2021   PFIZER Comirnaty(Gray Top)Covid-19 Tri-Sucrose Vaccine 04/05/2021   PFIZER(Purple Top)SARS-COV-2 Vaccination 02/13/2020, 03/15/2020, 10/16/2020, 04/05/2021   PNEUMOCOCCAL CONJUGATE-20 09/11/2021   Pneumococcal Conjugate-13 06/01/2018   Pneumococcal Polysaccharide-23 07/08/2019, 09/04/2020   Td 07/01/1997   Tdap 07/08/2019, 07/08/2019   Zoster Recombinat (Shingrix) 09/10/2019, 12/19/2019   There are no preventive care reminders to display for this patient.     Past Medical History:  Diagnosis Date   HYPERLIPIDEMIA 09/05/2008   HYPERTENSION 09/05/2008   Past Surgical History:   Procedure Laterality Date   ABDOMINAL HYSTERECTOMY      reports that she has never smoked. She has never used smokeless tobacco. She reports current alcohol use. She reports that she does not use drugs. family history includes Diabetes in her mother; Hypertension in her mother; Peripheral vascular disease in her mother; Stroke in her mother. No Known Allergies Current Outpatient Medications on File Prior to Visit  Medication Sig Dispense Refill   BESIVANCE 0.6 % SUSP Place 1 drop into the left eye 3 (three) times daily.     cycloSPORINE (RESTASIS) 0.05 % ophthalmic emulsion Restasis 0.05 % eye drops in a dropperette     DUREZOL 0.05 % EMUL Place 1 drop into the left eye 3 (three) times daily.     Influenza vac split quadrivalent PF (FLUZONE HIGH-DOSE) 0.5 ML injection Fluzone High-Dose 2019-20 (PF) 180 mcg/0.5 mL intramuscular syringe  TO BE ADMINISTERED BY PHARMACIST FOR IMMUNIZATION     PROLENSA 0.07 % SOLN SMARTSIG:1 Drop(s) Left Eye Every Evening     No current facility-administered medications on file prior to visit.        ROS:  All others reviewed and negative.  Objective        PE:  BP (!) 146/88 (BP Location: Left Arm, Patient Position: Sitting, Cuff Size: Large)   Pulse 96   Temp 98 F (36.7 C) (Oral)   Ht 5\' 3"  (1.6  m)   Wt 178 lb (80.7 kg)   SpO2 99%   BMI 31.53 kg/m                 Constitutional: Pt appears in NAD               HENT: Head: NCAT.                Right Ear: External ear normal.                 Left Ear: External ear normal.                Eyes: . Pupils are equal, round, and reactive to light. Conjunctivae and EOM are normal               Nose: without d/c or deformity               Neck: Neck supple. Gross normal ROM               Cardiovascular: Normal rate and regular rhythm.                 Pulmonary/Chest: Effort normal and breath sounds without rales or wheezing.                Abd:  Soft, NT, ND, + BS, no organomegaly                Neurological: Pt is alert. At baseline orientation, motor grossly intact               Skin: Skin is warm. No rashes, no other new lesions, LE edema - none               Psychiatric: Pt behavior is normal without agitation   Micro: none  Cardiac tracings I have personally interpreted today:  none  Pertinent Radiological findings (summarize): none   Lab Results  Component Value Date   WBC 9.0 07/13/2020   HGB 12.5 07/13/2020   HCT 38.0 07/13/2020   PLT 399.0 07/13/2020   GLUCOSE 91 07/13/2020   CHOL 179 07/13/2020   TRIG 78.0 07/13/2020   HDL 82.80 07/13/2020   LDLDIRECT 181.0 06/01/2018   LDLCALC 81 07/13/2020   ALT 14 07/13/2020   AST 20 07/13/2020   NA 140 07/13/2020   K 4.1 07/13/2020   CL 104 07/13/2020   CREATININE 0.78 07/13/2020   BUN 9 07/13/2020   CO2 30 07/13/2020   TSH 0.96 07/13/2020   HGBA1C 5.8 07/13/2020   Assessment/Plan:  Claudia Myers is a 70 y.o. Black or African American [2] female with  has a past medical history of HYPERLIPIDEMIA (09/05/2008) and HYPERTENSION (09/05/2008).  Vitamin D deficiency Last vitamin D Lab Results  Component Value Date   VD25OH 32.31 07/13/2020   Low, to start oral replacement   Encounter for well adult exam with abnormal findings Age and sex appropriate education and counseling updated with regular exercise and diet Referrals for preventative services - none needed Immunizations addressed - declines covid booster Smoking counseling  - none needed Evidence for depression or other mood disorder - none significant Most recent labs reviewed. I have personally reviewed and have noted: 1) the patient's medical and social history 2) The patient's current medications and supplements 3) The patient's height, weight, and BMI have been recorded in the chart   HLD (hyperlipidemia) Lab Results  Component Value Date   LDLCALC 81 07/13/2020   Stable,  pt to continue current statin crestor 20   Essential  hypertension BP Readings from Last 3 Encounters:  10/26/21 (!) 146/88  07/13/20 (!) 158/70  07/08/19 (!) 174/122   Uncontrolled here, pt states much better at home and declines change in tx,, pt to continue medical treatment amlodipine 5 mg and continue to f/u monitoring at home and next visit   Hyperglycemia Lab Results  Component Value Date   HGBA1C 5.8 07/13/2020   Stable, pt to continue current medical treatment  - diet  Iron deficiency anemia With no overt bleeding recent, now for f/u cbc and iron labs,  to f/u any worsening symptoms or concerns  Followup: Return in about 1 year (around 10/26/2022).  Oliver Barre, MD 10/27/2021 5:26 AM [facility]

## 2021-10-26 NOTE — Patient Instructions (Signed)
If not already, Please take OTC Vitamin D3 at 2000 units per day, indefinitely  Please continue all other medications as before, and refills have been done if requested.  Please have the pharmacy call with any other refills you may need.  Please continue your efforts at being more active, low cholesterol diet, and weight control.  You are otherwise up to date with prevention measures today.  Please keep your appointments with your specialists as you may have planned  Please go to the LAB at the blood drawing area for the tests to be done  You will be contacted by phone if any changes need to be made immediately.  Otherwise, you will receive a letter about your results with an explanation, but please check with MyChart first.  Please remember to sign up for MyChart if you have not done so, as this will be important to you in the future with finding out test results, communicating by private email, and scheduling acute appointments online when needed.  Please make an Appointment to return for your 1 year visit, or sooner if needed

## 2021-10-26 NOTE — Assessment & Plan Note (Signed)
Last vitamin D Lab Results  Component Value Date   VD25OH 32.31 07/13/2020   Low, to start oral replacement

## 2021-10-27 ENCOUNTER — Encounter: Payer: Self-pay | Admitting: Internal Medicine

## 2021-10-27 NOTE — Assessment & Plan Note (Signed)
BP Readings from Last 3 Encounters:  10/26/21 (!) 146/88  07/13/20 (!) 158/70  07/08/19 (!) 174/122   Uncontrolled here, pt states much better at home and declines change in tx,, pt to continue medical treatment amlodipine 5 mg and continue to f/u monitoring at home and next visit

## 2021-10-27 NOTE — Assessment & Plan Note (Signed)
Lab Results  Component Value Date   LDLCALC 81 07/13/2020   Stable, pt to continue current statin crestor 20

## 2021-10-27 NOTE — Assessment & Plan Note (Signed)
With no overt bleeding recent, now for f/u cbc and iron labs,  to f/u any worsening symptoms or concerns

## 2021-10-27 NOTE — Assessment & Plan Note (Signed)
Lab Results  Component Value Date   HGBA1C 5.8 07/13/2020   Stable, pt to continue current medical treatment  - diet

## 2021-10-27 NOTE — Assessment & Plan Note (Signed)

## 2021-10-29 ENCOUNTER — Encounter: Payer: Self-pay | Admitting: Internal Medicine

## 2021-10-29 LAB — VITAMIN D 25 HYDROXY (VIT D DEFICIENCY, FRACTURES): VITD: 35.86 ng/mL (ref 30.00–100.00)

## 2021-10-29 LAB — FERRITIN: Ferritin: 43.7 ng/mL (ref 10.0–291.0)

## 2021-10-29 LAB — VITAMIN B12: Vitamin B-12: 898 pg/mL (ref 211–911)

## 2021-10-29 LAB — TSH: TSH: 0.79 u[IU]/mL (ref 0.35–5.50)

## 2022-02-11 NOTE — H&P (Signed)
?  Patient referred by  DDS for extraction teeth@2 , 4, 12, 17, 21. ? ?CC: No pain. ? ?Past Medical History:  High Blood Pressure, Obese  ? ?Medications: Amlodipine, Rosuvastatin, D3   ? ?Allergies:     None   ? ?Surgeries:   knee surgery, Cataract    ? ?Social History       ?Smoking:  n          ?Alcohol:n ?Drug use:n            ?                 ?Exam: BMI 31. Hyper-erupted # 2. Caries #4, 12, 17, 21. Large bilateral Lingual Tori.  No purulence, edema, fluctuance, trismus. Oral cancer screening negative. Pharynx clear. No lymphadenopathy. ? ?Panorex:Hyper-erupted # 2. Caries #4, 12, 17, 21. ? ?Assessment: ASA 2. Non-restorable teeth 2, 4, 12, 17, 21.              ? ?Plan: Extraction Teeth #  2, 4, 12, 17, 21.  Removal lingual tori.        Hospital Day surgery.                ? ?Rx: n              ? ?Risks and complications explained. Questions answered.  ? ?Georgia Lopes, DMD ? ?

## 2022-02-12 ENCOUNTER — Other Ambulatory Visit: Payer: Self-pay

## 2022-02-12 ENCOUNTER — Encounter (HOSPITAL_COMMUNITY): Payer: Self-pay | Admitting: Oral Surgery

## 2022-02-12 NOTE — Progress Notes (Signed)
PCP - Dr. Jonny Ruiz  ?Cardiologist - denies  ?EKG -  ?Chest x-ray -  ?ECHO -  ?Cardiac Cath -  ? ?ERAS Protcol - n/a ?COVID TEST- n/a ? ?Anesthesia review: n/a ? ?------------- ? ?SDW INSTRUCTIONS: ? ?Your procedure is scheduled on Thurs 3/9. Please report to [facility] Main Entrance "A" at 1045 A.M., and check in at the Admitting office. Call this number if you have problems the morning of surgery: [phone] ? ? ?Remember: Do not eat after midnight the night before your surgery ? ?You may drink clear liquids until 10:15 AM the morning of your surgery.   ?Clear liquids allowed are: Water, Non-Citrus Juices (without pulp), Carbonated Beverages, Clear Tea, Black Coffee Only, and Gatorade ?  ?Medications to take morning of surgery with a sip of water include: ?Amlodipine ?Rosuvastatin  ? ?As of today, STOP taking any Aspirin (unless otherwise instructed by your surgeon), Aleve, Naproxen, Ibuprofen, Motrin, Advil, Goody's, BC's, all herbal medications, fish oil, and all vitamins. ? ?  ?The Morning of Surgery ?Do not wear jewelry, make-up or nail polish. ?Do not wear lotions, powders, or perfumes, or deodorant ?Do not bring valuables to the hospital. ?[facility] is not responsible for any belongings or valuables. ? ?If you are a smoker, DO NOT Smoke 24 hours prior to surgery ? ?If you wear a CPAP at night please bring your mask the morning of surgery  ? ?Remember that you must have someone to transport you home after your surgery, and remain with you for 24 hours if you are discharged the same day. ? ?Please bring cases for contacts, glasses, hearing aids, dentures or bridgework because it cannot be worn into surgery.  ? ?Patients discharged the day of surgery will not be allowed to drive home.  ? ?Please shower the NIGHT BEFORE/MORNING OF SURGERY (use antibacterial soap like DIAL soap if possible). Wear comfortable clothes the morning of surgery. Oral Hygiene is also important to reduce your risk of infection.  Remember  - BRUSH YOUR TEETH THE MORNING OF SURGERY WITH YOUR REGULAR TOOTHPASTE ? ?Patient denies shortness of breath, fever, cough and chest pain.  ? ? ?   ? ?

## 2022-02-13 NOTE — Progress Notes (Signed)
Pt made aware of time change, new arrival time 1350 ? ?

## 2022-02-14 ENCOUNTER — Ambulatory Visit (HOSPITAL_COMMUNITY)
Admission: RE | Admit: 2022-02-14 | Discharge: 2022-02-14 | Disposition: A | Discharge status: Home / Self Care | Payer: 59 | Attending: Oral Surgery | Admitting: Oral Surgery

## 2022-02-14 ENCOUNTER — Ambulatory Visit (HOSPITAL_BASED_OUTPATIENT_CLINIC_OR_DEPARTMENT_OTHER): Payer: 59 | Admitting: Anesthesiology

## 2022-02-14 ENCOUNTER — Other Ambulatory Visit: Payer: Self-pay

## 2022-02-14 ENCOUNTER — Ambulatory Visit (HOSPITAL_COMMUNITY): Payer: 59 | Admitting: Anesthesiology

## 2022-02-14 ENCOUNTER — Encounter (HOSPITAL_COMMUNITY): Payer: Self-pay | Admitting: Oral Surgery

## 2022-02-14 ENCOUNTER — Encounter (HOSPITAL_COMMUNITY)
Admission: RE | Disposition: A | Discharge status: Home / Self Care | Payer: Self-pay | Source: Home / Self Care | Attending: Oral Surgery

## 2022-02-14 DIAGNOSIS — I10 Essential (primary) hypertension: Secondary | ICD-10-CM

## 2022-02-14 DIAGNOSIS — D649 Anemia, unspecified: Secondary | ICD-10-CM

## 2022-02-14 DIAGNOSIS — K0889 Other specified disorders of teeth and supporting structures: Secondary | ICD-10-CM | POA: Diagnosis not present

## 2022-02-14 DIAGNOSIS — M270 Developmental disorders of jaws: Secondary | ICD-10-CM

## 2022-02-14 DIAGNOSIS — E669 Obesity, unspecified: Secondary | ICD-10-CM | POA: Insufficient documentation

## 2022-02-14 DIAGNOSIS — Z6831 Body mass index (BMI) 31.0-31.9, adult: Secondary | ICD-10-CM | POA: Diagnosis not present

## 2022-02-14 HISTORY — PX: TOOTH EXTRACTION: SHX859

## 2022-02-14 LAB — CBC
HCT: 44.2 % (ref 36.0–46.0)
Hemoglobin: 14.2 g/dL (ref 12.0–15.0)
MCH: 31.1 pg (ref 26.0–34.0)
MCHC: 32.1 g/dL (ref 30.0–36.0)
MCV: 96.7 fL (ref 80.0–100.0)
Platelets: 387 10*3/uL (ref 150–400)
RBC: 4.57 MIL/uL (ref 3.87–5.11)
RDW: 12.5 % (ref 11.5–15.5)
WBC: 10.4 10*3/uL (ref 4.0–10.5)
nRBC: 0 % (ref 0.0–0.2)

## 2022-02-14 SURGERY — DENTAL RESTORATION/EXTRACTIONS
Anesthesia: General

## 2022-02-14 MED ORDER — PROPOFOL 10 MG/ML IV BOLUS
INTRAVENOUS | Status: DC | PRN
  Administered 2022-02-14: 130 mg via INTRAVENOUS

## 2022-02-14 MED ORDER — SUGAMMADEX SODIUM 200 MG/2ML IV SOLN
INTRAVENOUS | Status: DC | PRN
  Administered 2022-02-14: 300 mg via INTRAVENOUS

## 2022-02-14 MED ORDER — SODIUM CHLORIDE 0.9 % IR SOLN
Status: DC | PRN
  Administered 2022-02-14: 1000 mL

## 2022-02-14 MED ORDER — ONDANSETRON HCL 4 MG/2ML IJ SOLN
INTRAMUSCULAR | Status: DC | PRN
  Administered 2022-02-14: 4 mg via INTRAVENOUS

## 2022-02-14 MED ORDER — ONDANSETRON HCL 4 MG/2ML IJ SOLN
INTRAMUSCULAR | Status: AC
  Filled 2022-02-14: qty 6

## 2022-02-14 MED ORDER — HYDROMORPHONE HCL 1 MG/ML IJ SOLN: 0.2500 mg | INTRAMUSCULAR | Status: DC | PRN

## 2022-02-14 MED ORDER — AMOXICILLIN 500 MG PO CAPS: 500.0000 mg | ORAL_CAPSULE | Freq: Three times a day (TID) | ORAL | 0 refills | Status: DC

## 2022-02-14 MED ORDER — DEXAMETHASONE SODIUM PHOSPHATE 10 MG/ML IJ SOLN
INTRAMUSCULAR | Status: AC
  Filled 2022-02-14: qty 1

## 2022-02-14 MED ORDER — LACTATED RINGERS IV SOLN
INTRAVENOUS | Status: DC
Start: 2022-02-14 — End: 2022-02-14

## 2022-02-14 MED ORDER — CHLORHEXIDINE GLUCONATE 0.12 % MT SOLN
15.0000 mL | OROMUCOSAL | Status: AC
  Administered 2022-02-14: 14:00:00 15 mL via OROMUCOSAL
  Filled 2022-02-14 (×2): qty 15

## 2022-02-14 MED ORDER — CEFAZOLIN SODIUM-DEXTROSE 2-4 GM/100ML-% IV SOLN
2.0000 g | INTRAVENOUS | Status: AC
  Administered 2022-02-14: 16:00:00 2 g via INTRAVENOUS
  Filled 2022-02-14: qty 100

## 2022-02-14 MED ORDER — PHENYLEPHRINE 40 MCG/ML (10ML) SYRINGE FOR IV PUSH (FOR BLOOD PRESSURE SUPPORT)
PREFILLED_SYRINGE | INTRAVENOUS | Status: AC
  Filled 2022-02-14: qty 10

## 2022-02-14 MED ORDER — ROCURONIUM BROMIDE 10 MG/ML (PF) SYRINGE
PREFILLED_SYRINGE | INTRAVENOUS | Status: DC | PRN
Start: 2022-02-14 — End: 2022-02-14
  Administered 2022-02-14: 80 mg via INTRAVENOUS

## 2022-02-14 MED ORDER — CEFAZOLIN SODIUM 1 G IJ SOLR
INTRAMUSCULAR | Status: AC
  Filled 2022-02-14: qty 20

## 2022-02-14 MED ORDER — FENTANYL CITRATE (PF) 250 MCG/5ML IJ SOLN
INTRAMUSCULAR | Status: DC | PRN
  Administered 2022-02-14: 100 ug via INTRAVENOUS

## 2022-02-14 MED ORDER — MEPERIDINE HCL 25 MG/ML IJ SOLN: 6.2500 mg | INTRAMUSCULAR | Status: DC | PRN

## 2022-02-14 MED ORDER — ACETAMINOPHEN 500 MG PO TABS
1000.0000 mg | ORAL_TABLET | Freq: Once | ORAL | Status: AC
  Administered 2022-02-14: 16:00:00 1000 mg via ORAL
  Filled 2022-02-14: qty 2

## 2022-02-14 MED ORDER — DEXMEDETOMIDINE (PRECEDEX) IN NS 20 MCG/5ML (4 MCG/ML) IV SYRINGE
PREFILLED_SYRINGE | INTRAVENOUS | Status: AC
  Filled 2022-02-14: qty 5

## 2022-02-14 MED ORDER — MIDAZOLAM HCL 2 MG/2ML IJ SOLN
INTRAMUSCULAR | Status: AC
  Filled 2022-02-14: qty 2

## 2022-02-14 MED ORDER — OXYCODONE-ACETAMINOPHEN 5-325 MG PO TABS: 1.0000 | ORAL_TABLET | ORAL | 0 refills | Status: DC | PRN

## 2022-02-14 MED ORDER — ONDANSETRON HCL 4 MG/2ML IJ SOLN
INTRAMUSCULAR | Status: AC
  Filled 2022-02-14: qty 2

## 2022-02-14 MED ORDER — ROCURONIUM BROMIDE 10 MG/ML (PF) SYRINGE
PREFILLED_SYRINGE | INTRAVENOUS | Status: AC
  Filled 2022-02-14: qty 30

## 2022-02-14 MED ORDER — DEXAMETHASONE SODIUM PHOSPHATE 10 MG/ML IJ SOLN
INTRAMUSCULAR | Status: AC
  Filled 2022-02-14: qty 3

## 2022-02-14 MED ORDER — LIDOCAINE 2% (20 MG/ML) 5 ML SYRINGE
INTRAMUSCULAR | Status: AC
  Filled 2022-02-14: qty 5

## 2022-02-14 MED ORDER — FENTANYL CITRATE (PF) 250 MCG/5ML IJ SOLN
INTRAMUSCULAR | Status: AC
  Filled 2022-02-14: qty 5

## 2022-02-14 MED ORDER — OXYCODONE HCL 5 MG PO TABS: 5.0000 mg | ORAL_TABLET | Freq: Once | ORAL | Status: DC | PRN

## 2022-02-14 MED ORDER — MIDAZOLAM HCL 2 MG/2ML IJ SOLN
INTRAMUSCULAR | Status: DC | PRN
  Administered 2022-02-14: 1 mg via INTRAVENOUS

## 2022-02-14 MED ORDER — OXYCODONE HCL 5 MG/5ML PO SOLN: 5.0000 mg | Freq: Once | ORAL | Status: DC | PRN

## 2022-02-14 MED ORDER — OXYMETAZOLINE HCL 0.05 % NA SOLN
NASAL | Status: DC | PRN
  Administered 2022-02-14: 2 via NASAL

## 2022-02-14 MED ORDER — LIDOCAINE 2% (20 MG/ML) 5 ML SYRINGE
INTRAMUSCULAR | Status: DC | PRN
Start: 2022-02-14 — End: 2022-02-14
  Administered 2022-02-14: 20 mg via INTRAVENOUS

## 2022-02-14 MED ORDER — AMISULPRIDE (ANTIEMETIC) 5 MG/2ML IV SOLN: 10.0000 mg | Freq: Once | INTRAVENOUS | Status: DC | PRN

## 2022-02-14 MED ORDER — ESMOLOL HCL 100 MG/10ML IV SOLN
INTRAVENOUS | Status: AC
  Filled 2022-02-14: qty 10

## 2022-02-14 MED ORDER — ESMOLOL HCL 100 MG/10ML IV SOLN
INTRAVENOUS | Status: DC | PRN
  Administered 2022-02-14: 30 mg via INTRAVENOUS
  Administered 2022-02-14: 40 mg via INTRAVENOUS
  Administered 2022-02-14: 30 mg via INTRAVENOUS

## 2022-02-14 MED ORDER — PROPOFOL 10 MG/ML IV BOLUS
INTRAVENOUS | Status: AC
  Filled 2022-02-14: qty 20

## 2022-02-14 MED ORDER — LIDOCAINE-EPINEPHRINE 2 %-1:100000 IJ SOLN
INTRAMUSCULAR | Status: DC | PRN
  Administered 2022-02-14: 20 mL via INTRADERMAL

## 2022-02-14 MED ORDER — DEXAMETHASONE SODIUM PHOSPHATE 10 MG/ML IJ SOLN
INTRAMUSCULAR | Status: DC | PRN
Start: 2022-02-14 — End: 2022-02-14
  Administered 2022-02-14: 10 mg via INTRAVENOUS

## 2022-02-14 MED ORDER — 0.9 % SODIUM CHLORIDE (POUR BTL) OPTIME
TOPICAL | Status: DC | PRN
  Administered 2022-02-14: 16:00:00 1000 mL

## 2022-02-14 MED ORDER — LIDOCAINE-EPINEPHRINE 2 %-1:100000 IJ SOLN
INTRAMUSCULAR | Status: AC
  Filled 2022-02-14: qty 1

## 2022-02-14 SURGICAL SUPPLY — 34 items
BAG COUNTER SPONGE SURGICOUNT (BAG) IMPLANT
BAG SPNG CNTER NS LX DISP (BAG)
BLADE SURG 15 STRL LF DISP TIS (BLADE) ×2 IMPLANT
BLADE SURG 15 STRL SS (BLADE) ×2
BUR EGG ELITE 4.0 (BURR) ×3 IMPLANT
CANISTER SUCT 3000ML PPV (MISCELLANEOUS) ×3 IMPLANT
COVER SURGICAL LIGHT HANDLE (MISCELLANEOUS) ×3 IMPLANT
DRAPE U-SHAPE 76X120 STRL (DRAPES) ×3 IMPLANT
GAUZE PACKING FOLDED 2  STR (GAUZE/BANDAGES/DRESSINGS) ×2
GAUZE PACKING FOLDED 2 STR (GAUZE/BANDAGES/DRESSINGS) ×2 IMPLANT
GLOVE SURG ENC MOIS LTX SZ6.5 (GLOVE) IMPLANT
GLOVE SURG ENC MOIS LTX SZ7 (GLOVE) IMPLANT
GLOVE SURG ENC MOIS LTX SZ8 (GLOVE) ×3 IMPLANT
GLOVE SURG UNDER POLY LF SZ6.5 (GLOVE) IMPLANT
GLOVE SURG UNDER POLY LF SZ7 (GLOVE) IMPLANT
GOWN STRL REUS W/ TWL LRG LVL3 (GOWN DISPOSABLE) ×2 IMPLANT
GOWN STRL REUS W/ TWL XL LVL3 (GOWN DISPOSABLE) ×2 IMPLANT
GOWN STRL REUS W/TWL LRG LVL3 (GOWN DISPOSABLE) ×2
GOWN STRL REUS W/TWL XL LVL3 (GOWN DISPOSABLE) ×2
IV NS 1000ML (IV SOLUTION) ×2
IV NS 1000ML BAXH (IV SOLUTION) ×2 IMPLANT
KIT BASIN OR (CUSTOM PROCEDURE TRAY) ×3 IMPLANT
KIT TURNOVER KIT B (KITS) ×3 IMPLANT
NDL HYPO 25GX1X1/2 BEV (NEEDLE) ×4 IMPLANT
NEEDLE HYPO 25GX1X1/2 BEV (NEEDLE) ×4 IMPLANT
NS IRRIG 1000ML POUR BTL (IV SOLUTION) ×3 IMPLANT
PAD ARMBOARD 7.5X6 YLW CONV (MISCELLANEOUS) ×3 IMPLANT
SLEEVE IRRIGATION ELITE 7 (MISCELLANEOUS) ×3 IMPLANT
SUT CHROMIC 3 0 PS 2 (SUTURE) ×3 IMPLANT
SYR BULB IRRIG 60ML STRL (SYRINGE) ×3 IMPLANT
SYR CONTROL 10ML LL (SYRINGE) ×3 IMPLANT
TRAY ENT MC OR (CUSTOM PROCEDURE TRAY) ×3 IMPLANT
TUBING IRRIGATION (MISCELLANEOUS) ×3 IMPLANT
YANKAUER SUCT BULB TIP NO VENT (SUCTIONS) ×3 IMPLANT

## 2022-02-14 NOTE — Op Note (Signed)
NAME: Claudia Myers, Claudia C. ?MEDICAL RECORD NO: [id number] ?ACCOUNT NO: [id number] ?DATE OF BIRTH: [birth year] ?FACILITY: [facility] ?LOCATION: [facility] ?PHYSICIAN: Georgia Lopes, DDS ? ?Operative Report  ? ?DATE OF PROCEDURE: 02/14/2022 ? ?PREOPERATIVE DIAGNOSES:  Nonrestorable teeth numbers 2, 4, 12, 17 and 21 and bilateral mandibular lingual tori. ? ?PROCEDURE:  Extraction teeth numbers 2, 4, 12, 17 and 21 and removal bilateral mandibular lingual tori. ? ?SURGEON:  Ocie Doyne, DDS ? ?ANESTHESIA:  General, nasal intubation.  Dr. Maple Hudson attending. ? ?DESCRIPTION OF PROCEDURE:  The patient was taken to the operating room and placed on the table in supine position.  General anesthesia was administered.  Nasal endotracheal tube was placed and secured.  The eyes were protected and the patient was draped  ?for surgery.  Timeout was performed.  The posterior pharynx was suctioned and a throat pack was placed.  2% lidocaine 1:100,000 epinephrine was infiltrated in an inferior alveolar block on the right and left sides and in buccal and palatal infiltration  ?on the maxilla, around the teeth to be removed.  A bite block was placed on the right side of the mouth.  A sweetheart retractor was used to retract the tongue.  A #15 blade was used to make an incision in the gingival sulcus of tooth #17, carried  ?forward on the alveolar crest until tooth #21 was encountered and the incision was created in the gingival sulcus around tooth #21.  The periosteum was reflected.  The teeth were elevated and removed with dental forceps.  Then, the 15 blade was used to  ?make a lingual incision from tooth #21 to tooth #24.  The lingual tissue was reflected to expose the lingual torus.  A Seldin was placed in the area to protect the lingual tissues and then the torus was reduced using the egg bur under irrigation.  The  ?bone file was then used to further smooth the area and then the area was irrigated and closed with 3-0 chromic. In the maxilla, tooth  #12 was removed using a 15 blade to make a sulcular incision.  The periosteum was reflected.  The tooth was elevated and ? removed with the dental forceps.  No suture was needed.  The bite block and sweetheart retractor were repositioned to the other side of the mouth.  A 15 blade used to make an incision around teeth numbers 2 and 4.  They were elevated and then removed  ?with the dental forceps.  Then, they were curetted.  No suture was required. In the mandible, a 15 blade used to make an incision from the area of the third molar along the edentulous mandible crest forward to tooth #22.  The periosteum was reflected  ?lingually and an incision was created lingually in the gingival sulcus to tooth #25.  The periosteum was reflected to expose the lingual torus.  The tissue was protected using the Seldin and the sweetheart and then the egg bur under irrigation was used  ?to reduce the torus and then the area was further smoothed with a bone file.  Then, the area was irrigated and closed with 3-0 chromic, then additional local anesthesia was administered.  The area was irrigated and suctioned.  Throat pack was removed.   ?The patient was left under care of anesthesia for extubation and transport to recovery room with plans for discharge home through day surgery. ? ?ESTIMATED BLOOD LOSS:  Minimum. ? ?COMPLICATIONS:  None. ? ?SPECIMENS:  No specimens. ? ? ?SHW ?D: 02/14/2022 4:46:50  pm T: 02/14/2022 11:01:00 pm  ?JOB: [id number]/ [id number]  ?

## 2022-02-14 NOTE — Op Note (Signed)
02/14/2022 ? ?4:42 PM ? ?PATIENT:  Claudia Myers  71 y.o. female ? ?PRE-OPERATIVE DIAGNOSIS: NON-RESTORABLE TEETH # 2, 4, 12, 17, 21.  BILATERAL MANDIBULAR LINGUAL TORI. ? ?POST-OPERATIVE DIAGNOSIS:  SAME ? ?PROCEDURE:  Procedure(s): EXTRACTION TEETH # 2, 4, 12, 17, 21.  REMOVAL BILATERAL MANDIBULAR LINGUAL TORI. ? ?SURGEON:  Surgeon(s): ?Ocie Doyne, DMD ? ?ANESTHESIA:   local and general ? ?EBL:  minimal ? ?DRAINS: none  ? ?SPECIMEN:  No Specimen ? ?COUNTS:  YES ? ?PLAN OF CARE: Discharge to home after PACU ? ?PATIENT DISPOSITION:  PACU - hemodynamically stable. ?  ?PROCEDURE DETAILS: ?Dictation #[id number] ? ?Georgia Lopes, DMD ?02/14/2022 ?4:42 PM ? ? ? ? ? ? ? ? ? ? ? ? ? ? ? ?  ?

## 2022-02-14 NOTE — Anesthesia Postprocedure Evaluation (Signed)
Anesthesia Post Note ? ?Patient: Claudia Myers ? ?Procedure(s) Performed: DENTAL RESTORATION/EXTRACTIONS ? ?  ? ?Patient location during evaluation: PACU ?Anesthesia Type: General ?Level of consciousness: awake and alert ?Pain management: pain level controlled ?Vital Signs Assessment: post-procedure vital signs reviewed and stable ?Respiratory status: spontaneous breathing, nonlabored ventilation, respiratory function stable and patient connected to nasal cannula oxygen ?Cardiovascular status: blood pressure returned to baseline and stable ?Postop Assessment: no apparent nausea or vomiting ?Anesthetic complications: no ? ? ?No notable events documented. ? ?Last Vitals:  ?Vitals:  ? 02/14/22 1650 02/14/22 1705  ?BP: (!) 151/104 (!) 159/93  ?Pulse: 93 84  ?Resp: 16 15  ?Temp: 36.7 ?C   ?SpO2: 97% 98%  ?  ?Last Pain:  ?Vitals:  ? 02/14/22 1650  ?TempSrc:   ?PainSc: 0-No pain  ? ? ?  ?  ?  ?  ?  ?  ? ?Jentri Aye ? ? ? ? ?

## 2022-02-14 NOTE — Anesthesia Preprocedure Evaluation (Addendum)
Anesthesia Evaluation  ?Patient identified by MRN, date of birth, ID band ?Patient awake ? ? ? ?Reviewed: ?Allergy & Precautions, NPO status , Patient's Chart, lab work & pertinent test results ? ?Airway ?Mallampati: II ? ?TM Distance: >3 FB ?Neck ROM: Full ? ? ? Dental ? ?(+) Dental Advisory Given ?  ?Pulmonary ?neg pulmonary ROS,  ?  ?Pulmonary exam normal ?breath sounds clear to auscultation ? ? ? ? ? ? Cardiovascular ?hypertension, Normal cardiovascular exam ?Rhythm:Regular Rate:Normal ? ? ?  ?Neuro/Psych ?negative neurological ROS ?   ? GI/Hepatic ?negative GI ROS, Neg liver ROS,   ?Endo/Other  ?negative endocrine ROS ? Renal/GU ?negative Renal ROS  ? ?  ?Musculoskeletal ?negative musculoskeletal ROS ?(+)  ? Abdominal ?(+) + obese,   ?Peds ? Hematology ? ?(+) Blood dyscrasia, anemia ,   ?Anesthesia Other Findings ? ? Reproductive/Obstetrics ? ?  ? ? ? ? ? ? ? ? ? ? ? ? ? ?  ?  ? ? ? ? ? ? ?Anesthesia Physical ?Anesthesia Plan ? ?ASA: 3 ? ?Anesthesia Plan: General  ? ?Post-op Pain Management: Tylenol PO (pre-op)* and Minimal or no pain anticipated  ? ?Induction: Intravenous ? ?PONV Risk Score and Plan: 3 and Ondansetron, Dexamethasone, Treatment may vary due to age or medical condition and Midazolam ? ?Airway Management Planned: Nasal ETT ? ?Additional Equipment:  ? ?Intra-op Plan:  ? ?Post-operative Plan: Extubation in OR ? ?Informed Consent: I have reviewed the patients History and Physical, chart, labs and discussed the procedure including the risks, benefits and alternatives for the proposed anesthesia with the patient or authorized representative who has indicated his/her understanding and acceptance.  ? ? ? ?Dental advisory given ? ?Plan Discussed with: CRNA ? ?Anesthesia Plan Comments:   ? ? ? ? ? ?Anesthesia Quick Evaluation ? ?

## 2022-02-14 NOTE — H&P (Signed)
H&P documentation  -History and Physical Reviewed  -Patient has been re-examined  -No change in the plan of care  Claudia Myers  

## 2022-02-14 NOTE — Anesthesia Procedure Notes (Signed)
Procedure Name: Intubation ?Date/Time: 02/14/2022 3:52 PM ?Performed by: Janace Litten, CRNA ?Pre-anesthesia Checklist: Patient identified, Emergency Drugs available, Suction available and Patient being monitored ?Patient Re-evaluated:Patient Re-evaluated prior to induction ?Oxygen Delivery Method: Circle System Utilized ?Preoxygenation: Pre-oxygenation with 100% oxygen ?Induction Type: IV induction ?Ventilation: Mask ventilation without difficulty ?Laryngoscope Size: Mac and 3 ?Grade View: Grade I ?Nasal Tubes: Nasal Rae, Nasal prep performed and Magill forceps- large, utilized ?Tube size: 6.5 mm ?Number of attempts: 1 ?Placement Confirmation: ETT inserted through vocal cords under direct vision, positive ETCO2 and breath sounds checked- equal and bilateral ?Tube secured with: Tape ?Dental Injury: Teeth and Oropharynx as per pre-operative assessment  ? ? ? ? ?

## 2022-02-14 NOTE — Transfer of Care (Signed)
Immediate Anesthesia Transfer of Care Note ? ?Patient: Claudia Myers ? ?Procedure(s) Performed: DENTAL RESTORATION/EXTRACTIONS ? ?Patient Location: PACU ? ?Anesthesia Type:General ? ?Level of Consciousness: drowsy, patient cooperative and responds to stimulation ? ?Airway & Oxygen Therapy: Patient Spontanous Breathing ? ?Post-op Assessment: Report given to RN and Post -op Vital signs reviewed and stable ? ?Post vital signs: Reviewed and stable ? ?Last Vitals:  ?Vitals Value Taken Time  ?BP 151/104 02/14/22 1650  ?Temp    ?Pulse 94 02/14/22 1652  ?Resp 20 02/14/22 1652  ?SpO2 98 % 02/14/22 1652  ?Vitals shown include unvalidated device data. ? ?Last Pain:  ?Vitals:  ? 02/14/22 1354  ?TempSrc:   ?PainSc: 0-No pain  ?   ? ?Patients Stated Pain Goal: 0 (02/14/22 1354) ? ?Complications: No notable events documented. ?

## 2022-02-15 ENCOUNTER — Encounter (HOSPITAL_COMMUNITY): Payer: Self-pay | Admitting: Oral Surgery

## 2022-07-15 ENCOUNTER — Other Ambulatory Visit: Payer: Self-pay | Admitting: Internal Medicine

## 2022-07-15 NOTE — Telephone Encounter (Signed)
Please refill as per office routine med refill policy (all routine meds to be refilled for 3 mo or monthly (per pt preference) up to one year from last visit, then month to month grace period for 3 mo, then further med refills will have to be denied) ? ?

## 2022-08-16 DIAGNOSIS — M25561 Pain in right knee: Secondary | ICD-10-CM | POA: Insufficient documentation

## 2022-08-21 DIAGNOSIS — M25561 Pain in right knee: Secondary | ICD-10-CM | POA: Diagnosis not present

## 2022-08-21 DIAGNOSIS — M1711 Unilateral primary osteoarthritis, right knee: Secondary | ICD-10-CM | POA: Diagnosis not present

## 2022-08-23 ENCOUNTER — Other Ambulatory Visit: Payer: Self-pay | Admitting: Internal Medicine

## 2022-08-23 DIAGNOSIS — Z1231 Encounter for screening mammogram for malignant neoplasm of breast: Secondary | ICD-10-CM

## 2022-09-01 DIAGNOSIS — M1711 Unilateral primary osteoarthritis, right knee: Secondary | ICD-10-CM | POA: Insufficient documentation

## 2022-09-02 ENCOUNTER — Ambulatory Visit (INDEPENDENT_AMBULATORY_CARE_PROVIDER_SITE_OTHER): Payer: Medicare Other

## 2022-09-02 VITALS — BP 148/98 | HR 105 | Temp 97.6°F | Ht 63.0 in | Wt 178.0 lb

## 2022-09-02 DIAGNOSIS — Z0000 Encounter for general adult medical examination without abnormal findings: Secondary | ICD-10-CM | POA: Diagnosis not present

## 2022-09-02 NOTE — Patient Instructions (Addendum)
It was great speaking with you today!! Keep up the great work staying active and healthy! :)   Please schedule your next Medicare Wellness Visit with your Nurse Health Advisor in 1 year by calling [phone].

## 2022-09-02 NOTE — Progress Notes (Signed)
Subjective:   Claudia Myers is a 71 y.o. female who presents for Medicare Annual (Subsequent) preventive examination.   Review of Systems    No ROS. Medicare Wellness Visit. Additional risk factors are reflected in social history. Cardiac Risk Factors include: advanced age (>58men, >5 women);obesity (BMI >30kg/m2);hypertension     Objective:    Today's Vitals   09/02/22 0941  BP: (!) 148/98  Pulse: (!) 105  Temp: 97.6 F (36.4 C)  TempSrc: Temporal  SpO2: 98%  Weight: 178 lb (80.7 kg)  Height: 5\' 3"  (1.6 m)   Body mass index is 31.53 kg/m.     09/02/2022   10:07 AM 02/14/2022    1:51 PM 08/30/2021   10:06 AM 10/24/2016   12:19 AM  Advanced Directives  Does Patient Have a Medical Advance Directive? No No No No  Would patient like information on creating a medical advance directive? No - Patient declined No - Patient declined Yes (MAU/Ambulatory/Procedural Areas - Information given)     Current Medications (verified) Outpatient Encounter Medications as of 09/02/2022  Medication Sig   amLODipine (NORVASC) 5 MG tablet Take 1 tablet (5 mg total) by mouth daily.   Cholecalciferol (VITAMIN D3) 50 MCG (2000 UT) TABS Take 2,000 Units by mouth daily.   Multiple Vitamins-Minerals (MULTIVITAMIN WITH MINERALS) tablet Take 1 tablet by mouth daily. Centrum   rosuvastatin (CRESTOR) 20 MG tablet Take 1 tablet (20 mg total) by mouth daily. Annual appt due in Sept must see provider for future refills   [DISCONTINUED] amoxicillin (AMOXIL) 500 MG capsule Take 1 capsule (500 mg total) by mouth 3 (three) times daily.   [DISCONTINUED] Influenza vac split quadrivalent PF (FLUZONE HIGH-DOSE) 0.5 ML injection Fluzone High-Dose 2019-20 (PF) 180 mcg/0.5 mL intramuscular syringe  TO BE ADMINISTERED BY PHARMACIST FOR IMMUNIZATION   [DISCONTINUED] oxyCODONE-acetaminophen (PERCOCET) 5-325 MG tablet Take 1-2 tablets by mouth every 4 (four) hours as needed for severe pain. (Patient not taking:  Reported on 09/02/2022)   No facility-administered encounter medications on file as of 09/02/2022.    Allergies (verified) Patient has no known allergies.   History: Past Medical History:  Diagnosis Date   HYPERLIPIDEMIA 09/05/2008   HYPERTENSION 09/05/2008   Past Surgical History:  Procedure Laterality Date   ABDOMINAL HYSTERECTOMY     EYE SURGERY  2020   cataracts removal   knee arthoscopy Right 2021   Dr. 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  Tobacco Use   Smoking status: Never   Smokeless tobacco: Never  Vaping Use   Vaping Use: Never used  Substance and Sexual Activity   Alcohol use: Yes    Comment: rarely   Drug use: No   Sexual activity: Not on file  Other Topics Concern   Not on file  Social History Narrative   Not  on file   Social Determinants of Health   Financial Resource Strain: Low Risk  (09/02/2022)   Overall Financial Resource Strain (CARDIA)    Difficulty of Paying Living Expenses: Not hard at all  Food Insecurity: No Food Insecurity (09/02/2022)   Hunger Vital Sign    Worried About Running Out of Food in the Last Year: Never true    Ran Out of Food in the Last Year: Never true  Transportation Needs: No Transportation Needs (09/02/2022)   PRAPARE - Administrator, Civil Service (Medical): No    Lack of Transportation (Non-Medical): No  Physical Activity: Sufficiently  Active (09/02/2022)   Exercise Vital Sign    Days of Exercise per Week: 5 days    Minutes of Exercise per Session: 60 min  Stress: No Stress Concern Present (09/02/2022)   [employer] of Occupational Health - Occupational Stress Questionnaire    Feeling of Stress : Not at all  Social Connections: Unknown (09/02/2022)   Social Connection and Isolation Panel [NHANES]    Frequency of Communication with Friends and Family: More than three times a week    Frequency of Social Gatherings with Friends and Family: More than three times a week    Attends Religious Services: 1 to 4 times per year    Active Member of [employer] or Organizations: No    Attends Engineer, structural: Never    Marital Status: Not on file    Tobacco Counseling Counseling given: Not Answered   Clinical Intake:  Pre-visit preparation completed: Yes  Pain : No/denies pain     Nutritional Status: BMI > 30  Obese Nutritional Risks: None Diabetes: No  How often do you need to have someone help you when you read instructions, pamphlets, or other written materials from your doctor or pharmacy?: 1 - Never What is the last grade level you completed in school?: 12th grade  Diabetic?no  Interpreter Needed?: No  Information entered by :: Elyse Jarvis, CMA   Activities of Daily Living    09/02/2022   10:07 AM 02/14/2022    1:51 PM  In your present state of health, do you have any difficulty performing the following activities:  Hearing? 0 0  Vision? 0 0  Difficulty concentrating or making decisions? 0 0  Walking or climbing stairs? 0 0  Dressing or bathing? 0 0  Doing errands, shopping? 0   Preparing Food and eating ? N   Using the Toilet? N   In the past six months, have you accidently leaked urine? N   Do you have problems with loss of bowel control? N   Managing your Medications? N   Managing your Finances? N   Housekeeping or managing your Housekeeping? N     Patient Care Team: Corwin Levins, MD as PCP - Gwynneth Munson, MD as Consulting Physician (Gastroenterology) Mia Creek, MD as Consulting Physician (Ophthalmology) Durene Romans, MD as Consulting Physician (Orthopedic Surgery)  Indicate any recent Medical Services you may have received from other than [facility] providers in the past year (date may be approximate).     Assessment:   This is a routine wellness examination for Malkia.  Hearing/Vision screen Patient denied any hearing difficulty. No hearing aids. Patient does wear corrective lenses.  Dietary issues and exercise activities discussed: Current Exercise Habits: Home exercise routine, Type of exercise: walking;Other - see comments (exercise bike and 25 crunches), Time (Minutes): 60, Frequency (Times/Week): 5, Weekly Exercise (Minutes/Week): 300, Intensity: Mild,  Exercise limited by: None identified   Goals Addressed             This Visit's Progress    Patient Stated       Patient would like to continue to stay active and healthy.        Depression Screen    09/02/2022   10:06 AM 10/26/2021    3:57 PM 10/26/2021    3:27 PM 08/30/2021   10:15 AM 07/13/2020   10:40 AM 07/08/2019    4:10 PM 06/01/2018    2:02 PM  PHQ 2/9 Scores  PHQ - 2 Score 0 0 0 0 0 0 0    Fall Risk    09/02/2022   10:07 AM 10/26/2021    3:57 PM 10/26/2021    3:27 PM 08/30/2021   10:07 AM 07/13/2020   10:40 AM  Fall Risk   Falls in the past year? 0 0 0 0 0  Number falls in past yr: 0 0 0 0 0  Injury with Fall? 0 0 0 0 0  Risk for fall due to : No Fall Risks   No Fall Risks No Fall Risks  Follow up Falls evaluation completed   Falls evaluation completed Falls evaluation completed    FALL RISK PREVENTION PERTAINING TO THE HOME:  Any stairs in or around the home? Yes  If so, are there any without handrails? Yes  Home free of loose throw rugs in walkways, pet beds, electrical cords, etc? Yes  Adequate lighting in your home to reduce risk of falls? Yes   ASSISTIVE  DEVICES UTILIZED TO PREVENT FALLS:  Life alert? No  Use of a cane, walker or w/c? No  Grab bars in the bathroom? Yes  Shower chair or bench in shower? No  Elevated toilet seat or a handicapped toilet? No   TIMED UP AND GO:  Was the test performed? No .  Length of time to ambulate 10 feet: N/A sec.   Patient stated that she has no issues with gait or balance; does not use any assistive devices.  Cognitive Function:  Patient is cogitatively intact.      09/02/2022   10:09 AM  6CIT Screen  What Year? 0 points  What month? 0 points  What time? 0 points  Count back from 20 0 points  Months in reverse 0 points  Repeat phrase 0 points  Total Score 0 points    Immunizations Immunization History  Administered Date(s) Administered   Fluad Quad(high Dose 65+) 08/19/2019   Influenza Inj Mdck Quad Pf 10/06/2017   Influenza Whole 10/12/2008   Influenza, High Dose Seasonal PF 09/17/2018, 08/15/2020, 08/21/2021, 07/13/2022   Influenza,inj,Quad PF,6+ Mos 11/13/2016   Influenza-Unspecified 09/08/2018, 08/21/2021, 07/13/2022   PFIZER Comirnaty(Gray Top)Covid-19 Tri-Sucrose Vaccine 04/05/2021   PFIZER(Purple Top)SARS-COV-2 Vaccination 02/13/2020, 03/15/2020, 10/16/2020, 04/05/2021   PNEUMOCOCCAL CONJUGATE-20 09/11/2021   Pfizer Covid-19 Vaccine Bivalent Booster 45yrs & up 12/21/2021   Pneumococcal Conjugate-13 06/01/2018   Pneumococcal Polysaccharide-23 07/08/2019, 09/04/2020   Td 07/01/1997   Tdap 07/08/2019, 07/08/2019   Zoster Recombinat (Shingrix) 09/10/2019, 12/19/2019    TDAP status: Up to date  Flu Vaccine status: Up to date  Pneumococcal vaccine status: Up to date  Covid-19 vaccine status: Completed vaccines  Qualifies for Shingles Vaccine? Yes   Zostavax completed No   Shingrix Completed?: Yes  Screening Tests Health Maintenance  Topic Date Due   COVID-19 Vaccine (7 - Pfizer risk series) 09/18/2022 (Originally 02/15/2022)   DEXA SCAN  10/26/2022 (  Originally  11/16/2017)   MAMMOGRAM  09/25/2023   TETANUS/TDAP  07/07/2029   COLONOSCOPY (Pts 45-16yrs Insurance coverage will need to be confirmed)  07/20/2029   Pneumonia Vaccine 21+ Years old  Completed   INFLUENZA VACCINE  Completed   Hepatitis C Screening  Completed   Zoster Vaccines- Shingrix  Completed   HPV VACCINES  Aged Out    Health Maintenance  There are no preventive care reminders to display for this patient.   Colorectal cancer screening: Type of screening: Colonoscopy. Completed 07/21/2019. Repeat every 10 years  Mammogram status: Ordered scheduled for 10/02/22. Pt provided with contact info and advised to call to schedule appt.   Bone Density: Due  Lung Cancer Screening: (Low Dose CT Chest recommended if Age 19-80 years, 30 pack-year currently smoking OR have quit w/in 15years.) does not qualify.   Lung Cancer Screening Referral: N/A  Additional Screening:  Hepatitis C Screening: does qualify; Completed 07/08/2019  Vision Screening: Recommended annual ophthalmology exams for early detection of glaucoma and other disorders of the eye. Is the patient up to date with their annual eye exam?  Yes  Who is the provider or what is the name of the office in which the patient attends annual eye exams? Dr. Catalina Antigua If pt is not established with a provider, would they like to be referred to a provider to establish care? No .   Dental Screening: Recommended annual dental exams for proper oral hygiene  Community Resource Referral / Chronic Care Management: CRR required this visit?  No   CCM required this visit?  No      Plan:     I have personally reviewed and noted the following in the patient's chart:   Medical and social history Use of alcohol, tobacco or illicit drugs  Current medications and supplements including opioid prescriptions. Patient is not currently taking opioid prescriptions. Functional ability and status Nutritional status Physical activity Advanced  directives List of other physicians Hospitalizations, surgeries, and ER visits in previous 12 months Vitals Screenings to include cognitive, depression, and falls Referrals and appointments  In addition, I have reviewed and discussed with patient certain preventive protocols, quality metrics, and best practice recommendations. A written personalized care plan for preventive services as well as general preventive health recommendations were provided to patient.     Rossie Muskrat, [place]   09/02/2022   Nurse Notes: N/A

## 2022-10-02 ENCOUNTER — Ambulatory Visit
Admission: RE | Admit: 2022-10-02 | Discharge: 2022-10-02 | Disposition: A | Discharge status: Home / Self Care | Payer: Medicare Other | Source: Ambulatory Visit | Attending: Internal Medicine | Admitting: Internal Medicine

## 2022-10-02 DIAGNOSIS — Z1231 Encounter for screening mammogram for malignant neoplasm of breast: Secondary | ICD-10-CM | POA: Diagnosis not present

## 2022-11-21 ENCOUNTER — Other Ambulatory Visit: Payer: Self-pay | Admitting: Internal Medicine

## 2022-11-21 NOTE — Telephone Encounter (Signed)
Please refill as per office routine med refill policy (all routine meds to be refilled for 3 mo or monthly (per pt preference) up to one year from last visit, then month to month grace period for 3 mo, then further med refills will have to be denied) ? ?

## 2022-12-14 ENCOUNTER — Other Ambulatory Visit: Payer: Self-pay | Admitting: Internal Medicine

## 2022-12-14 NOTE — Telephone Encounter (Signed)
Please refill as per office routine med refill policy (all routine meds to be refilled for 3 mo or monthly (per pt preference) up to one year from last visit, then month to month grace period for 3 mo, then further med refills will have to be denied) ? ?

## 2022-12-18 ENCOUNTER — Other Ambulatory Visit: Payer: Self-pay | Admitting: Internal Medicine

## 2022-12-18 NOTE — Telephone Encounter (Signed)
Please refill as per office routine med refill policy (all routine meds to be refilled for 3 mo or monthly (per pt preference) up to one year from last visit, then month to month grace period for 3 mo, then further med refills will have to be denied) ? ?

## 2022-12-24 ENCOUNTER — Ambulatory Visit: Payer: Medicaid Other | Admitting: Internal Medicine

## 2022-12-25 ENCOUNTER — Ambulatory Visit: Payer: 59 | Admitting: Internal Medicine

## 2022-12-25 ENCOUNTER — Ambulatory Visit (INDEPENDENT_AMBULATORY_CARE_PROVIDER_SITE_OTHER): Payer: 59 | Admitting: Internal Medicine

## 2022-12-25 ENCOUNTER — Encounter: Payer: Self-pay | Admitting: Internal Medicine

## 2022-12-25 VITALS — BP 156/90 | HR 104 | Temp 98.0°F | Ht 63.0 in | Wt 177.0 lb

## 2022-12-25 DIAGNOSIS — E538 Deficiency of other specified B group vitamins: Secondary | ICD-10-CM | POA: Diagnosis not present

## 2022-12-25 DIAGNOSIS — E7800 Pure hypercholesterolemia, unspecified: Secondary | ICD-10-CM

## 2022-12-25 DIAGNOSIS — Z0001 Encounter for general adult medical examination with abnormal findings: Secondary | ICD-10-CM | POA: Diagnosis not present

## 2022-12-25 DIAGNOSIS — E2839 Other primary ovarian failure: Secondary | ICD-10-CM

## 2022-12-25 DIAGNOSIS — R739 Hyperglycemia, unspecified: Secondary | ICD-10-CM | POA: Diagnosis not present

## 2022-12-25 DIAGNOSIS — E559 Vitamin D deficiency, unspecified: Secondary | ICD-10-CM | POA: Diagnosis not present

## 2022-12-25 DIAGNOSIS — I10 Essential (primary) hypertension: Secondary | ICD-10-CM | POA: Diagnosis not present

## 2022-12-25 LAB — CBC WITH DIFFERENTIAL/PLATELET
Basophils Absolute: 0 10*3/uL (ref 0.0–0.1)
Basophils Relative: 0.3 % (ref 0.0–3.0)
Eosinophils Absolute: 0 10*3/uL (ref 0.0–0.7)
Eosinophils Relative: 0.4 % (ref 0.0–5.0)
HCT: 40.4 % (ref 36.0–46.0)
Hemoglobin: 13.5 g/dL (ref 12.0–15.0)
Lymphocytes Relative: 15.8 % (ref 12.0–46.0)
Lymphs Abs: 1.7 10*3/uL (ref 0.7–4.0)
MCHC: 33.5 g/dL (ref 30.0–36.0)
MCV: 92.4 fl (ref 78.0–100.0)
Monocytes Absolute: 0.6 10*3/uL (ref 0.1–1.0)
Monocytes Relative: 5.8 % (ref 3.0–12.0)
Neutro Abs: 8.6 10*3/uL — ABNORMAL HIGH (ref 1.4–7.7)
Neutrophils Relative %: 77.7 % — ABNORMAL HIGH (ref 43.0–77.0)
Platelets: 454 10*3/uL — ABNORMAL HIGH (ref 150.0–400.0)
RBC: 4.38 Mil/uL (ref 3.87–5.11)
RDW: 13.4 % (ref 11.5–15.5)
WBC: 11 10*3/uL — ABNORMAL HIGH (ref 4.0–10.5)

## 2022-12-25 LAB — URINALYSIS, ROUTINE W REFLEX MICROSCOPIC
Bilirubin Urine: NEGATIVE
Hgb urine dipstick: NEGATIVE
Ketones, ur: NEGATIVE
Nitrite: NEGATIVE
RBC / HPF: NONE SEEN (ref 0–?)
Specific Gravity, Urine: 1.01 (ref 1.000–1.030)
Total Protein, Urine: NEGATIVE
Urine Glucose: NEGATIVE
Urobilinogen, UA: 0.2 (ref 0.0–1.0)
pH: 6 (ref 5.0–8.0)

## 2022-12-25 LAB — HEPATIC FUNCTION PANEL
ALT: 19 U/L (ref 0–35)
AST: 19 U/L (ref 0–37)
Albumin: 4.6 g/dL (ref 3.5–5.2)
Alkaline Phosphatase: 104 U/L (ref 39–117)
Bilirubin, Direct: 0.2 mg/dL (ref 0.0–0.3)
Total Bilirubin: 0.8 mg/dL (ref 0.2–1.2)
Total Protein: 8.4 g/dL — ABNORMAL HIGH (ref 6.0–8.3)

## 2022-12-25 LAB — BASIC METABOLIC PANEL
BUN: 10 mg/dL (ref 6–23)
CO2: 30 mEq/L (ref 19–32)
Calcium: 10.2 mg/dL (ref 8.4–10.5)
Chloride: 100 mEq/L (ref 96–112)
Creatinine, Ser: 0.87 mg/dL (ref 0.40–1.20)
GFR: 67.65 mL/min (ref >60.00)
Glucose, Bld: 92 mg/dL (ref 70–99)
Potassium: 3.8 mEq/L (ref 3.5–5.1)
Sodium: 138 mEq/L (ref 135–145)

## 2022-12-25 LAB — VITAMIN D 25 HYDROXY (VIT D DEFICIENCY, FRACTURES): VITD: 32.43 ng/mL (ref 30.00–100.00)

## 2022-12-25 LAB — TSH: TSH: 1.53 u[IU]/mL (ref 0.35–5.50)

## 2022-12-25 LAB — LIPID PANEL
Cholesterol: 210 mg/dL — ABNORMAL HIGH (ref 0–200)
HDL: 87.9 mg/dL (ref >39.00)
LDL Cholesterol: 94 mg/dL (ref 0–99)
NonHDL: 121.87
Total CHOL/HDL Ratio: 2
Triglycerides: 137 mg/dL (ref 0.0–149.0)
VLDL: 27.4 mg/dL (ref 0.0–40.0)

## 2022-12-25 LAB — HEMOGLOBIN A1C: Hgb A1c MFr Bld: 5.8 % (ref 4.6–6.5)

## 2022-12-25 LAB — VITAMIN B12: Vitamin B-12: 761 pg/mL (ref 211–911)

## 2022-12-25 MED ORDER — AMLODIPINE BESYLATE 5 MG PO TABS
5.0000 mg | ORAL_TABLET | Freq: Every day | ORAL | 3 refills | Status: DC
Start: 2022-12-25 — End: 2023-12-25

## 2022-12-25 MED ORDER — ROSUVASTATIN CALCIUM 20 MG PO TABS: 20.0000 mg | ORAL_TABLET | Freq: Every day | ORAL | 3 refills | Status: DC

## 2022-12-25 NOTE — Progress Notes (Signed)
Patient ID: Claudia Myers, female   DOB: 11-08-1951, 72 y.o.   MRN: [id number]         Chief Complaint:: wellness exam and htn, hld, low vit d       HPI:  Claudia Myers is a 72 y.o. female here for wellness exam; due for DXA; o/w up to date                        Also BP < 140/90 at home.  Pt denies chest pain, increased sob or doe, wheezing, orthopnea, PND, increased LE swelling, palpitations, dizziness or syncope.   Pt denies polydipsia, polyuria, or new focal neuro s/s.    Pt denies fever, wt loss, night sweats, loss of appetite, or other constitutional symptoms  No overt bleeding or bruising.     Wt Readings from Last 3 Encounters:  12/25/22 177 lb (80.3 kg)  09/02/22 178 lb (80.7 kg)  02/14/22 175 lb (79.4 kg)   BP Readings from Last 3 Encounters:  12/25/22 (!) 156/90  09/02/22 (!) 148/98  02/14/22 (!) 156/80   Immunization History  Administered Date(s) Administered   Fluad Quad(high Dose 65+) 08/19/2019   Influenza Inj Mdck Quad Pf 10/06/2017   Influenza Whole 10/12/2008   Influenza, High Dose Seasonal PF 09/17/2018, 08/15/2020, 08/21/2021, 07/13/2022   Influenza,inj,Quad PF,6+ Mos 11/13/2016   Influenza-Unspecified 09/08/2018, 08/21/2021, 07/13/2022   PFIZER Comirnaty(Gray Top)Covid-19 Tri-Sucrose Vaccine 04/05/2021   PFIZER(Purple Top)SARS-COV-2 Vaccination 02/13/2020, 03/15/2020, 10/16/2020, 04/05/2021   PNEUMOCOCCAL CONJUGATE-20 09/11/2021   Pfizer Covid-19 Vaccine Bivalent Booster 19yrs & up 12/21/2021   Pneumococcal Conjugate-13 06/01/2018   Pneumococcal Polysaccharide-23 07/08/2019, 09/04/2020   Td 07/01/1997   Tdap 07/08/2019, 07/08/2019   Zoster Recombinat (Shingrix) 09/10/2019, 12/19/2019   There are no preventive care reminders to display for this patient.     Past Medical History:  Diagnosis Date   HYPERLIPIDEMIA 09/05/2008   HYPERTENSION 09/05/2008   Past Surgical History:  Procedure Laterality Date   ABDOMINAL HYSTERECTOMY     EYE  SURGERY  2020   cataracts removal   knee arthoscopy Right 2021   Dr. Charlann Boxer   TOOTH EXTRACTION N/A 02/14/2022   Procedure: DENTAL RESTORATION/EXTRACTIONS;  Surgeon: Ocie Doyne, DMD;  Location: [facility] OR;  Service: Oral Surgery;  Laterality: N/A;    reports that she has never smoked. She has never used smokeless tobacco. She reports current alcohol use. She reports that she does not use drugs. family history includes Diabetes in her mother; Hypertension in her mother; Peripheral vascular disease in her mother; Stroke in her mother. No Known Allergies Current Outpatient Medications on File Prior to Visit  Medication Sig Dispense Refill   Cholecalciferol (VITAMIN D3) 50 MCG (2000 UT) TABS Take 2,000 Units by mouth daily.     Multiple Vitamins-Minerals (MULTIVITAMIN WITH MINERALS) tablet Take 1 tablet by mouth daily. Centrum     No current facility-administered medications on file prior to visit.        ROS:  All others reviewed and negative.  Objective        PE:  BP (!) 156/90 (BP Location: Right Arm, Patient Position: Sitting, Cuff Size: Large)   Pulse (!) 104   Temp 98 F (36.7 C) (Oral)   Ht 5\' 3"  (1.6 m)   Wt 177 lb (80.3 kg)   SpO2 96%   BMI 31.35 kg/m                 Constitutional: Pt appears  in NAD               HENT: Head: NCAT.                Right Ear: External ear normal.                 Left Ear: External ear normal.                Eyes: . Pupils are equal, round, and reactive to light. Conjunctivae and EOM are normal               Nose: without d/c or deformity               Neck: Neck supple. Gross normal ROM               Cardiovascular: Normal rate and regular rhythm.                 Pulmonary/Chest: Effort normal and breath sounds without rales or wheezing.                Abd:  Soft, NT, ND, + BS, no organomegaly               Neurological: Pt is alert. At baseline orientation, motor grossly intact               Skin: Skin is warm. No rashes, no other new lesions,  LE edema - none               Psychiatric: Pt behavior is normal without agitation   Micro: none  Cardiac tracings I have personally interpreted today:  none  Pertinent Radiological findings (summarize): none   Lab Results  Component Value Date   WBC 11.0 (H) 12/25/2022   HGB 13.5 12/25/2022   HCT 40.4 12/25/2022   PLT 454.0 (H) 12/25/2022   GLUCOSE 92 12/25/2022   CHOL 210 (H) 12/25/2022   TRIG 137.0 12/25/2022   HDL 87.90 12/25/2022   LDLDIRECT 181.0 06/01/2018   LDLCALC 94 12/25/2022   ALT 19 12/25/2022   AST 19 12/25/2022   NA 138 12/25/2022   K 3.8 12/25/2022   CL 100 12/25/2022   CREATININE 0.87 12/25/2022   BUN 10 12/25/2022   CO2 30 12/25/2022   TSH 1.53 12/25/2022   HGBA1C 5.8 12/25/2022   Assessment/Plan:  Claudia Myers is a 72 y.o. Black or African American [2] female with  has a past medical history of HYPERLIPIDEMIA (09/05/2008) and HYPERTENSION (09/05/2008).  Vitamin D deficiency Last vitamin D Lab Results  Component Value Date   VD25OH 35.86 10/26/2021   Low, to start oral replacement   Encounter for well adult exam with abnormal findings Age and sex appropriate education and counseling updated with regular exercise and diet Referrals for preventative services - for DXA Immunizations addressed - none needed Smoking counseling  - none needed Evidence for depression or other mood disorder - none significant Most recent labs reviewed. I have personally reviewed and have noted: 1) the patient's medical and social history 2) The patient's current medications and supplements 3) The patient's height, weight, and BMI have been recorded in the chart   Essential hypertension BP Readings from Last 3 Encounters:  12/25/22 (!) 156/90  09/02/22 (!) 148/98  02/14/22 (!) 156/80   Uncontrolled again here, pt is adamant BP < 14090 at home, pt to continue medical treatment norvasc 5 mg per day   HLD (hyperlipidemia) Lab Results  Component Value  Date   LDLCALC 94 12/25/2022   Stable, pt to continue current statin crestor 20 mg qd   Hyperglycemia Lab Results  Component Value Date   HGBA1C 5.8 12/25/2022   Stable, pt to continue current medical treatment  - diet, wt control, excercise  Followup: Return in about 1 year (around 12/26/2023).  Cathlean Cower, MD 12/26/2022 9:48 PM [facility]

## 2022-12-25 NOTE — Patient Instructions (Addendum)
Please schedule the bone density test before leaving today at the scheduling desk (where you check out)  Please continue all other medications as before, and refills have been done if requested.  Please have the pharmacy call with any other refills you may need.  Please continue your efforts at being more active, low cholesterol diet, and weight control.  You are otherwise up to date with prevention measures today.  Please keep your appointments with your specialists as you may have planned  Please go to the LAB at the blood drawing area for the tests to be done  You will be contacted by phone if any changes need to be made immediately.  Otherwise, you will receive a letter about your results with an explanation, but please check with MyChart first.  Please remember to sign up for MyChart if you have not done so, as this will be important to you in the future with finding out test results, communicating by private email, and scheduling acute appointments online when needed.  Please make an Appointment to return for your 1 year visit, or sooner if needed

## 2022-12-25 NOTE — Assessment & Plan Note (Signed)
Last vitamin D Lab Results  Component Value Date   VD25OH 35.86 10/26/2021   Low, to start oral replacement

## 2022-12-26 ENCOUNTER — Encounter: Payer: Self-pay | Admitting: Internal Medicine

## 2022-12-26 ENCOUNTER — Other Ambulatory Visit: Payer: Self-pay | Admitting: Internal Medicine

## 2022-12-26 ENCOUNTER — Other Ambulatory Visit: Payer: 59

## 2022-12-26 ENCOUNTER — Ambulatory Visit (INDEPENDENT_AMBULATORY_CARE_PROVIDER_SITE_OTHER)
Admission: RE | Admit: 2022-12-26 | Discharge: 2022-12-26 | Disposition: A | Discharge status: Home / Self Care | Payer: 59 | Source: Ambulatory Visit

## 2022-12-26 DIAGNOSIS — E2839 Other primary ovarian failure: Secondary | ICD-10-CM

## 2022-12-26 MED ORDER — ALENDRONATE SODIUM 70 MG PO TABS: 70.0000 mg | ORAL_TABLET | ORAL | 3 refills | Status: DC

## 2022-12-26 NOTE — Assessment & Plan Note (Signed)
Lab Results  Component Value Date   HGBA1C 5.8 12/25/2022   Stable, pt to continue current medical treatment  - diet, wt control, excercise

## 2022-12-26 NOTE — Assessment & Plan Note (Signed)
Lab Results  Component Value Date   LDLCALC 94 12/25/2022   Stable, pt to continue current statin crestor 20 mg qd

## 2022-12-26 NOTE — Assessment & Plan Note (Signed)
Age and sex appropriate education and counseling updated with regular exercise and diet Referrals for preventative services - for DXA Immunizations addressed - none needed Smoking counseling  - none needed Evidence for depression or other mood disorder - none significant Most recent labs reviewed. I have personally reviewed and have noted: 1) the patient's medical and social history 2) The patient's current medications and supplements 3) The patient's height, weight, and BMI have been recorded in the chart

## 2022-12-26 NOTE — Assessment & Plan Note (Signed)
BP Readings from Last 3 Encounters:  12/25/22 (!) 156/90  09/02/22 (!) 148/98  02/14/22 (!) 156/80   Uncontrolled again here, pt is adamant BP < 14090 at home, pt to continue medical treatment norvasc 5 mg per day

## 2023-01-02 IMAGING — MG MM DIGITAL DIAGNOSTIC UNILAT*R* W/ TOMO W/ CAD
4 series · 4 of 12 positions shown · non-contrast
Comparison: Previous exam(s).

CLINICAL DATA: 69-year-old female for further evaluation of
possible RIGHT breast asymmetry on screening mammogram.

EXAM:
DIGITAL DIAGNOSTIC UNILATERAL RIGHT MAMMOGRAM WITH TOMOSYNTHESIS AND
CAD
TECHNIQUE: Right digital diagnostic mammography and breast tomosynthesis was
performed. The images were evaluated with computer-aided detection.

[R CC synth-2D]
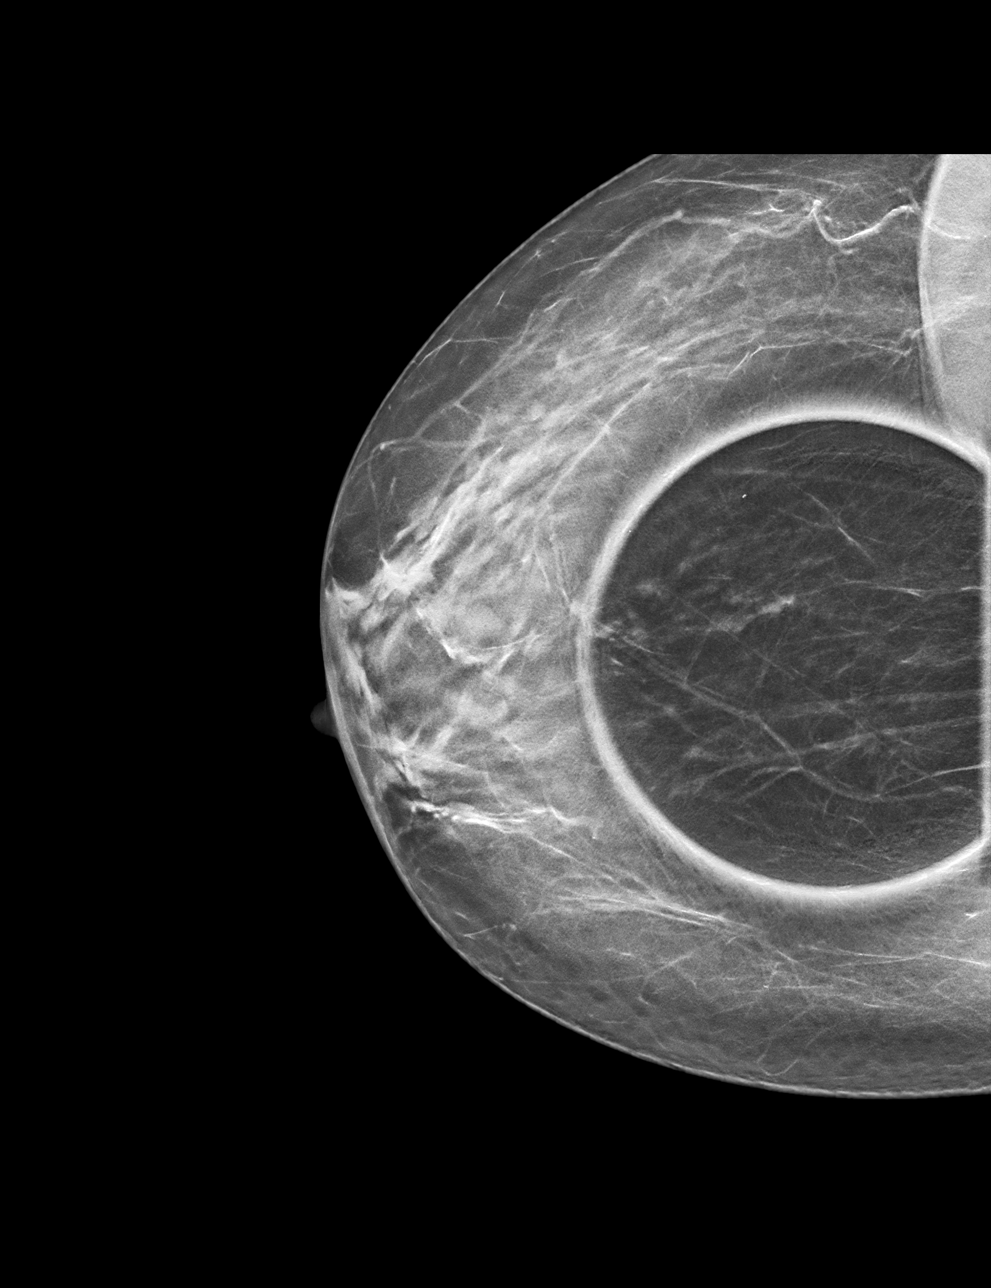

[R MLO synth-2D]
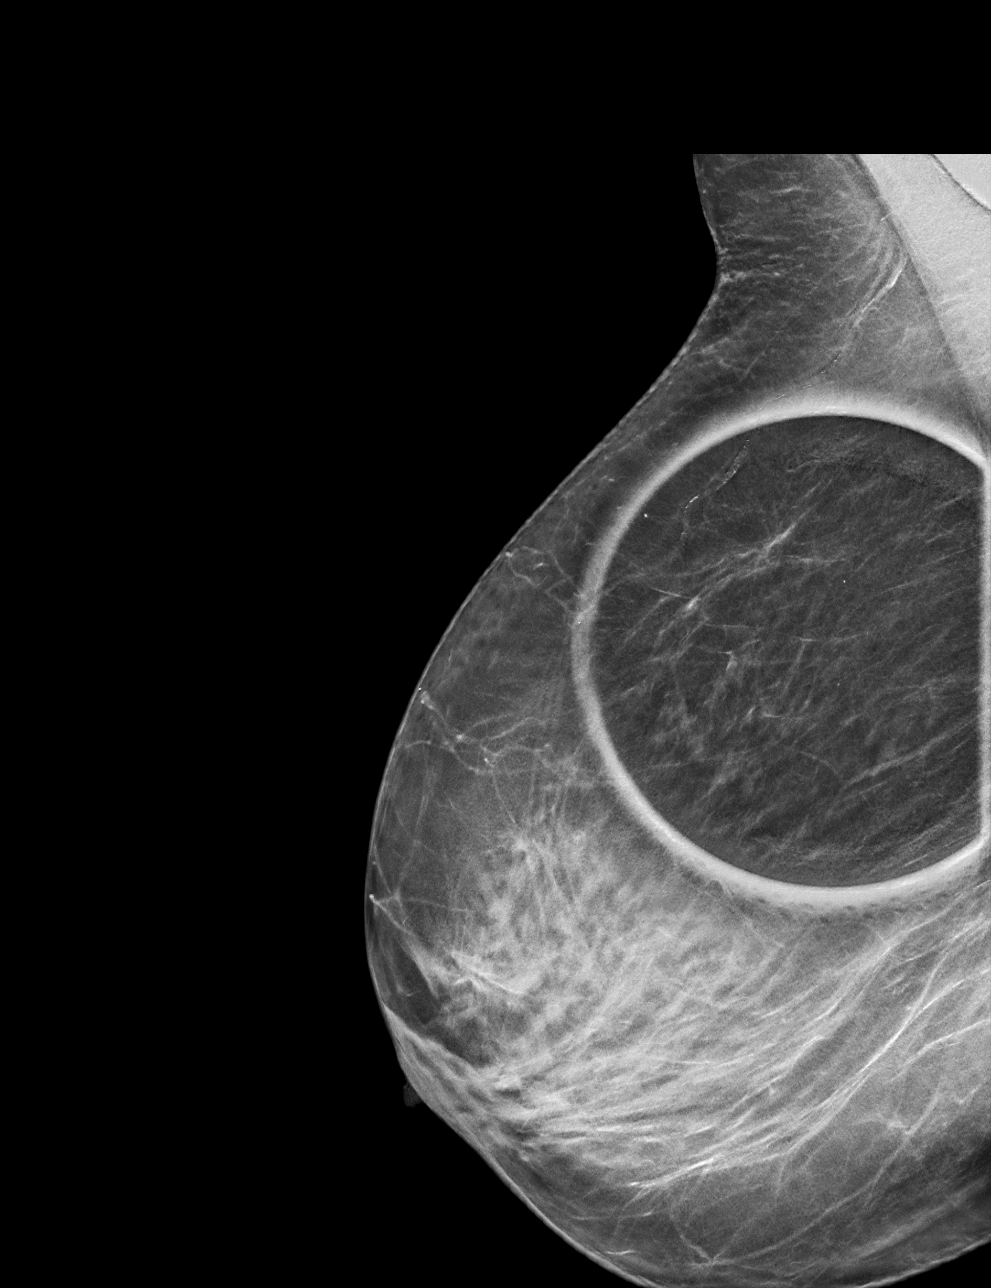

[R CC tomo · tomo slice 25/50.0]
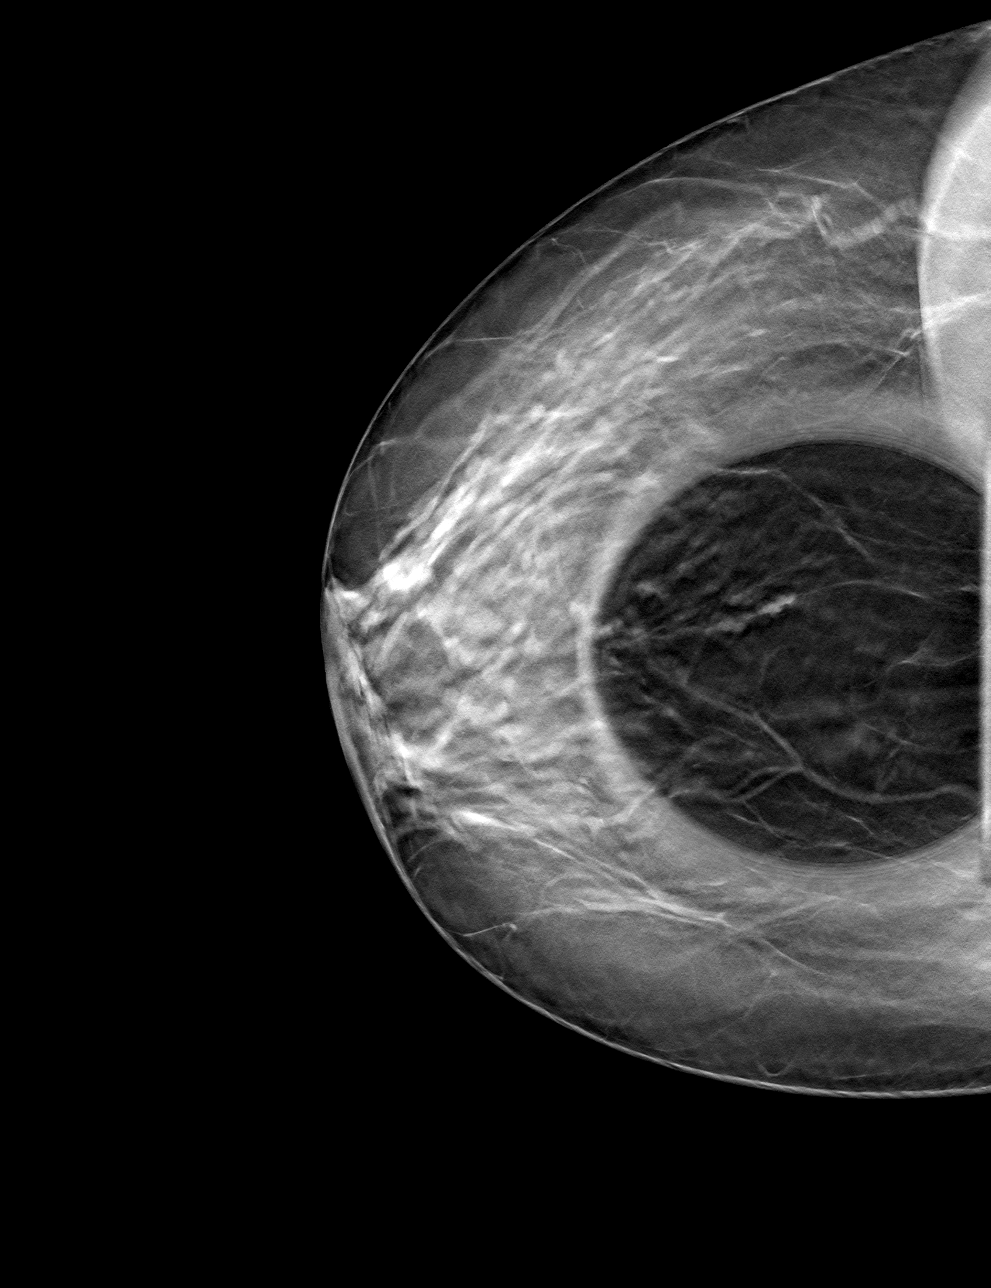

[R MLO tomo · tomo slice 29/57.0]
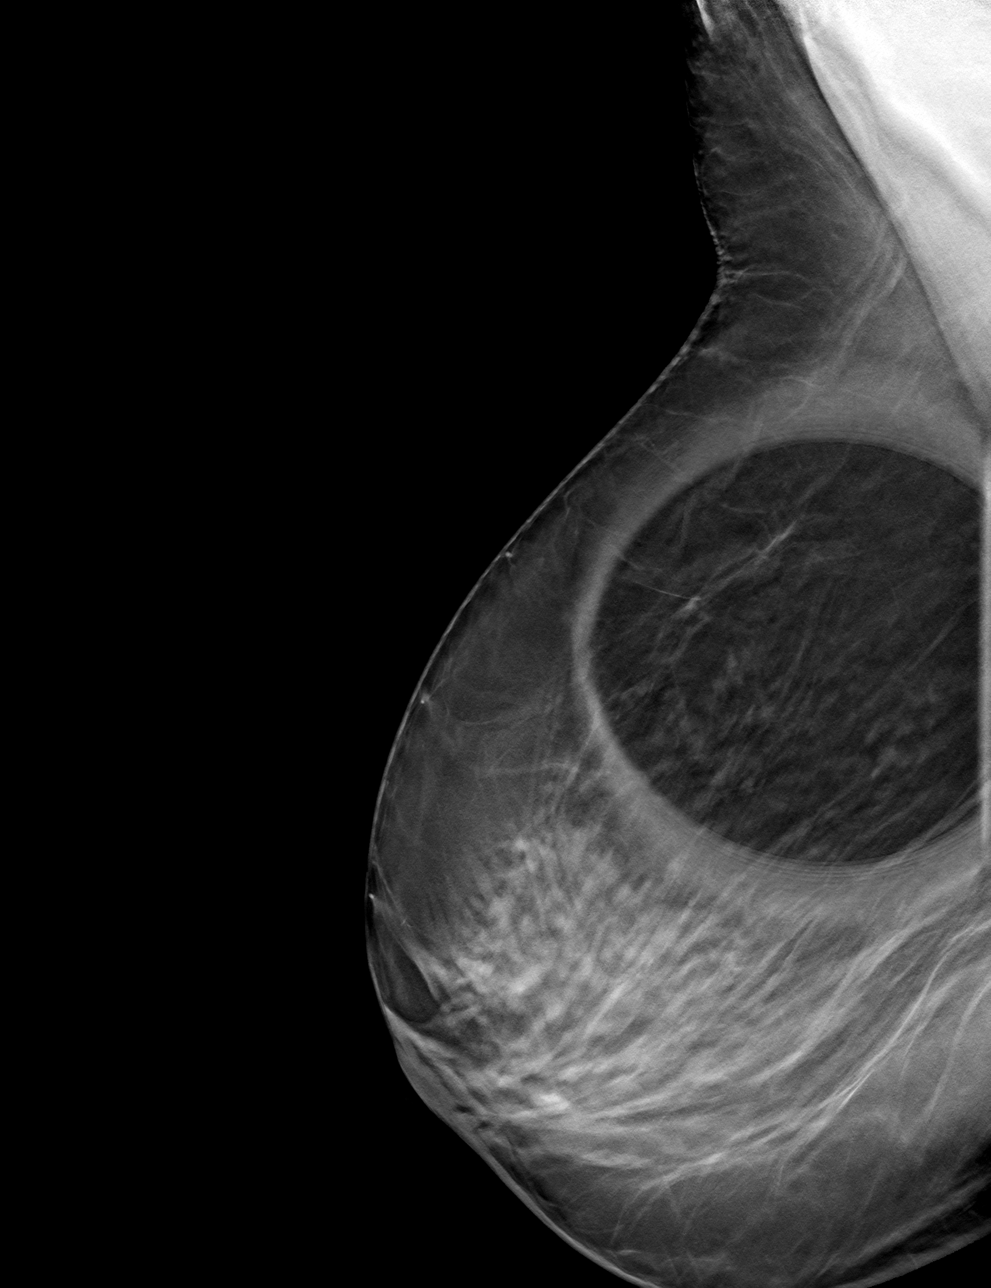

[4 of 12 positions shown; findings below may reference images not displayed]

ACR Breast Density Category b: There are scattered areas of
fibroglandular density.
FINDINGS: 2D/3D spot compression views of the RIGHT breast demonstrate
dispersal of the screening study asymmetry without persistent
abnormality in this area.
IMPRESSION: No persistent abnormality at the site of the screening study
finding.

RECOMMENDATION:
Bilateral screening mammogram in 1 year.

I have discussed the findings and recommendations with the patient.
If applicable, a reminder letter will be sent to the patient
regarding the next appointment.

BI-RADS CATEGORY  1: Negative.

## 2023-01-17 ENCOUNTER — Telehealth: Payer: Self-pay | Admitting: Internal Medicine

## 2023-01-17 MED ORDER — ALENDRONATE SODIUM 70 MG PO TABS: 70.0000 mg | ORAL_TABLET | ORAL | 3 refills | Status: DC

## 2023-01-17 NOTE — Telephone Encounter (Signed)
Ok to wait for 2 weeks at this point, then restart at once per wk

## 2023-01-17 NOTE — Telephone Encounter (Signed)
Please advise 

## 2023-01-17 NOTE — Telephone Encounter (Signed)
PT calls today to inform us they had been taking their medication incorrectly. PT was supposed to be taking their alendronate (FOSAMAX) 70 MG tablet once a week. PT was taking this medication once a day. PT is now currently down to one tablet. PT does not complain of any adverse symptoms for taking the medication incorrectly.  PT wants to know if they should wait until next Friday to take the next tablet now or how to proceed.  CB: [phone]

## 2023-01-20 NOTE — Telephone Encounter (Signed)
Please disregard last message. 

## 2023-01-20 NOTE — Telephone Encounter (Signed)
Patient states that pharmacy informed her to restart the fosmax on April 7th, please advise as I informed her of your advice.

## 2023-05-15 DIAGNOSIS — H16223 Keratoconjunctivitis sicca, not specified as Sjogren's, bilateral: Secondary | ICD-10-CM | POA: Diagnosis not present

## 2023-08-21 DIAGNOSIS — M1711 Unilateral primary osteoarthritis, right knee: Secondary | ICD-10-CM | POA: Diagnosis not present

## 2023-08-26 ENCOUNTER — Other Ambulatory Visit: Payer: Self-pay | Admitting: Internal Medicine

## 2023-08-26 DIAGNOSIS — Z1231 Encounter for screening mammogram for malignant neoplasm of breast: Secondary | ICD-10-CM

## 2023-09-11 ENCOUNTER — Ambulatory Visit: Payer: 59

## 2023-09-11 VITALS — Ht 63.0 in | Wt 169.0 lb

## 2023-09-11 DIAGNOSIS — Z0000 Encounter for general adult medical examination without abnormal findings: Secondary | ICD-10-CM | POA: Diagnosis not present

## 2023-09-11 NOTE — Progress Notes (Signed)
Subjective:   Claudia Myers is a 72 y.o. female who presents for Medicare Annual (Subsequent) preventive examination.  Visit Complete: Virtual  I connected with  Claudia Myers on 09/11/23 by a video and audio enabled telemedicine application and verified that I am speaking with the correct person using two identifiers.  Patient Location: Home  Provider Location: Office/Clinic  I discussed the limitations of evaluation and management by telemedicine. The patient expressed understanding and agreed to proceed.  Because this visit was a virtual/telehealth visit, some criteria may be missing or patient reported. Any vitals not documented were not able to be obtained and vitals that have been documented are patient reported.   Cardiac Risk Factors include: advanced age (>57men, >66 women);dyslipidemia;family history of premature cardiovascular disease;hypertension     Objective:    Today's Vitals   09/11/23 0932  Weight: 169 lb (76.7 kg)  Height: 5\' 3"  (1.6 m)  PainSc: 0-No pain   Body mass index is 29.94 kg/m.     09/11/2023    9:34 AM 09/02/2022   10:07 AM 02/14/2022    1:51 PM 08/30/2021   10:06 AM 10/24/2016   12:19 AM  Advanced Directives  Does Patient Have a Medical Advance Directive? No No No No No  Would patient like information on creating a medical advance directive? No - Patient declined No - Patient declined No - Patient declined Yes (MAU/Ambulatory/Procedural Areas - Information given)     Current Medications (verified) Outpatient Encounter Medications as of 09/11/2023  Medication Sig   alendronate (FOSAMAX) 70 MG tablet Take 1 tablet (70 mg total) by mouth every 7 (seven) days. Take with a full glass of water on an empty stomach.   amLODipine (NORVASC) 5 MG tablet Take 1 tablet (5 mg total) by mouth daily.   Cholecalciferol (VITAMIN D3) 50 MCG (2000 UT) TABS Take 2,000 Units by mouth daily.   Multiple Vitamins-Minerals (MULTIVITAMIN WITH MINERALS)  tablet Take 1 tablet by mouth daily. Centrum   rosuvastatin (CRESTOR) 20 MG tablet Take 1 tablet (20 mg total) by mouth daily.   No facility-administered encounter medications on file as of 09/11/2023.    Allergies (verified) Patient has no known allergies.   History: Past Medical History:  Diagnosis Date   HYPERLIPIDEMIA 09/05/2008   HYPERTENSION 09/05/2008   Past Surgical History:  Procedure Laterality Date   ABDOMINAL HYSTERECTOMY     EYE SURGERY  2020   cataracts removal   knee arthoscopy Right 2021   Dr. 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  Tobacco Use   Smoking status: Never   Smokeless tobacco: Never  Vaping Use   Vaping status: Never Used  Substance and Sexual Activity   Alcohol use: Yes    Comment: rarely   Drug use: No   Sexual activity: Not on  file  Other Topics Concern   Not on file  Social History Narrative   Not on file   Social Determinants of Health   Financial Resource Strain: Low Risk  (09/11/2023)   Overall Financial Resource Strain (CARDIA)    Difficulty of Paying Living Expenses: Not hard at all  Food Insecurity: No Food Insecurity (09/11/2023)   Hunger Vital Sign    Worried About Running Out of Food in the Last Year: Never true    Ran Out of Food in the Last Year: Never true  Transportation Needs: No Transportation Needs (09/11/2023)   PRAPARE - Scientist, research (physical sciences) (Medical): No    Lack of Transportation (Non-Medical): No  Physical Activity: Sufficiently Active (09/11/2023)   Exercise Vital Sign    Days of Exercise per Week: 5 days    Minutes of Exercise per Session: 60 min  Stress: No Stress Concern Present (09/11/2023)   [employer] of Occupational Health - Occupational Stress Questionnaire    Feeling of Stress : Not at all  Social Connections: Moderately Isolated (09/11/2023)   Social Connection and Isolation Panel [NHANES]    Frequency of Communication with Friends and Family: More than three times a week    Frequency of Social Gatherings with Friends and Family: More than three times a week    Attends Religious Services: More than 4 times per year    Active Member of [employer] or Organizations: No    Attends Banker Meetings: Never    Marital Status: Widowed    Tobacco Counseling Counseling given: Not Answered   Clinical Intake:  Pre-visit preparation completed: Yes  Pain : No/denies pain Pain Score: 0-No pain     BMI - recorded: 29.94 Nutritional Status: BMI 25 -29 Overweight Nutritional Risks: None Diabetes: No  How often do you need to have someone help you when you read instructions, pamphlets, or other written materials from your doctor or pharmacy?: 1 - Never What is the last grade level you completed in school?: HSG  Interpreter Needed?: No  Information entered by :: Dalayza Zambrana N. Darien Mignogna, LPN.   Activities of Daily Living    09/11/2023    9:37 AM  In your present state of health, do you have any difficulty performing the following activities:  Hearing? 0  Vision? 0  Difficulty concentrating or making decisions? 0  Walking or climbing stairs? 0  Dressing or bathing? 0  Doing errands, shopping? 0  Preparing Food and eating ? N  Using the Toilet? N  In the past six months, have you accidently leaked urine? N  Do you have problems with loss of bowel control? N  Managing your  Medications? N  Managing your Finances? N  Housekeeping or managing your Housekeeping? N    Patient Care Team: Corwin Levins, MD as PCP - Gwynneth Munson, MD as Consulting Physician (Gastroenterology) Mia Creek, MD as Consulting Physician (Ophthalmology) Durene Romans, MD as Consulting Physician (Orthopedic Surgery) Conley Rolls, [facility] [place], [place] as Referring Physician (Optometry)  Indicate any recent Medical Services you may have received from other than [facility] providers in the past year (date may be approximate).     Assessment:   This is a routine wellness examination for Solae.  Hearing/Vision screen Hearing Screening - Comments:: Patient denied any hearing difficulty.   No hearing aids.   Vision Screening - Comments:: Patient does wear corrective lenses/contacts.  Annual eye exam done by: My Sharlene Motts, OD. at [facility]  Optical     Goals Addressed             This Visit's Progress    My healthcare goal for 2024-2025 is to maintain my current health status by continuing to eat healthy, stay independent, physically and socially active.        Depression Screen    09/11/2023    9:36 AM 12/25/2022    9:42 AM 12/25/2022    9:26 AM 09/02/2022   10:06 AM 10/26/2021    3:57 PM 10/26/2021    3:27 PM 08/30/2021   10:15 AM  PHQ 2/9 Scores  PHQ - 2 Score 0 0 0 0 0 0 0  PHQ- 9 Score 0  0        Fall Risk    09/11/2023    9:35 AM 12/25/2022    9:42 AM 12/25/2022    9:26 AM 09/02/2022   10:07 AM 10/26/2021    3:57 PM  Fall Risk   Falls in the past year? 0 0 0 0 0  Number falls in past yr: 0 0 0 0 0  Injury with Fall? 0 0 0 0 0  Risk for fall due to : No Fall Risks  No Fall Risks No Fall Risks   Follow up Falls prevention discussed  Falls evaluation completed Falls evaluation completed     MEDICARE RISK AT HOME: Medicare Risk at Home Any stairs in or around the home?: No If so, are there any without handrails?: No Home free of loose throw rugs in walkways, pet beds,  electrical cords, etc?: Yes Adequate lighting in your home to reduce risk of falls?: Yes Life alert?: No Use of a cane, walker or w/c?: No Grab bars in the bathroom?: Yes Shower chair or bench in shower?: Yes Elevated toilet seat or a handicapped toilet?: No  TIMED UP AND GO:  Was the test performed?  No    Cognitive Function:        09/11/2023    9:37 AM 09/02/2022   10:09 AM  6CIT Screen  What Year? 0 points 0 points  What month? 0 points 0 points  What time? 0 points 0 points  Count back from 20 0 points 0 points  Months in reverse 0 points 0 points  Repeat phrase 0 points 0 points  Total Score 0 points 0 points    Immunizations Immunization History  Administered Date(s) Administered   Fluad Quad(high Dose 65+) 08/19/2019   Influenza Inj Mdck Quad Pf 10/06/2017   Influenza Whole 10/12/2008   Influenza, High Dose Seasonal PF 09/17/2018, 08/15/2020, 08/21/2021, 07/13/2022   Influenza,inj,Quad PF,6+ Mos 11/13/2016   Influenza-Unspecified 09/08/2018, 08/21/2021, 07/13/2022   Moderna Covid-19 Fall Seasonal Vaccine 47yrs & older 08/16/2023   PFIZER Comirnaty(Gray Top)Covid-19 Tri-Sucrose Vaccine 04/05/2021   PFIZER(Purple Top)SARS-COV-2 Vaccination 02/13/2020, 03/15/2020, 10/16/2020, 04/05/2021   PNEUMOCOCCAL CONJUGATE-20 09/11/2021   Pfizer Covid-19 Vaccine Bivalent Booster 80yrs & up 12/21/2021   Pneumococcal Conjugate-13 06/01/2018   Pneumococcal Polysaccharide-23 07/08/2019, 09/04/2020   Td 07/01/1997   Tdap 07/08/2019, 07/08/2019   Zoster Recombinant(Shingrix) 09/10/2019, 12/19/2019    TDAP status: Up to date  Flu Vaccine status: Up to date  Pneumococcal vaccine status: Up to date  Covid-19 vaccine status: Completed vaccines  Qualifies for Shingles Vaccine? Yes   Zostavax completed No   Shingrix Completed?: Yes  Screening Tests Health Maintenance  Topic Date Due   COVID-19 Vaccine (8 - 2023-24 season) 10/11/2023   Medicare Annual Wellness (AWV)   09/10/2024  MAMMOGRAM  10/02/2024   DTaP/Tdap/Td (4 - Td or Tdap) 07/07/2029   Colonoscopy  07/20/2029   Pneumonia Vaccine 8+ Years old  Completed   INFLUENZA VACCINE  Completed   DEXA SCAN  Completed   Hepatitis C Screening  Completed   Zoster Vaccines- Shingrix  Completed   HPV VACCINES  Aged Out    Health Maintenance  There are no preventive care reminders to display for this patient.   Colorectal cancer screening: Type of screening: Colonoscopy. Completed 07/21/2019. Repeat every 10 years  Mammogram status: Completed 10/02/2022. Repeat every year  Bone Density status: Completed 12/26/2022. Results reflect: Bone density results: OSTEOPENIA. Repeat every 2 years.  Lung Cancer Screening: (Low Dose CT Chest recommended if Age 69-80 years, 20 pack-year currently smoking OR have quit w/in 15years.) does not qualify.   Lung Cancer Screening Referral: no  Additional Screening:  Hepatitis C Screening: does qualify; Completed 07/08/2019  Vision Screening: Recommended annual ophthalmology exams for early detection of glaucoma and other disorders of the eye. Is the patient up to date with their annual eye exam?  Yes  Who is the provider or what is the name of the office in which the patient attends annual eye exams? [facility] [place], [place]. If pt is not established with a provider, would they like to be referred to a provider to establish care? No .   Dental Screening: Recommended annual dental exams for proper oral hygiene  Diabetic Foot Exam: N/A  Community Resource Referral / Chronic Care Management: CRR required this visit?  No   CCM required this visit?  No     Plan:     I have personally reviewed and noted the following in the patient's chart:   Medical and social history Use of alcohol, tobacco or illicit drugs  Current medications and supplements including opioid prescriptions. Patient is not currently taking opioid prescriptions. Functional ability and  status Nutritional status Physical activity Advanced directives List of other physicians Hospitalizations, surgeries, and ER visits in previous 12 months Vitals Screenings to include cognitive, depression, and falls Referrals and appointments  In addition, I have reviewed and discussed with patient certain preventive protocols, quality metrics, and best practice recommendations. A written personalized care plan for preventive services as well as general preventive health recommendations were provided to patient.     Mickeal Needy, LPN   [id number]/[id number]   After Visit Summary: (Mail) Due to this being a telephonic visit, the after visit summary with patients personalized plan was offered to patient via mail   Nurse Notes: Normal cognitive status assessed by direct observation via telephone conversation by this Nurse Health Advisor. No abnormalities found.

## 2023-09-11 NOTE — Patient Instructions (Signed)
Ms. Ellingwood , Thank you for taking time to come for your Medicare Wellness Visit. I appreciate your ongoing commitment to your health goals. Please review the following plan we discussed and let me know if I can assist you in the future.   Referrals/Orders/Follow-Ups/Clinician Recommendations: No  This is a list of the screening recommended for you and due dates:  Health Maintenance  Topic Date Due   COVID-19 Vaccine (8 - 2023-24 season) 10/11/2023   Medicare Annual Wellness Visit  09/10/2024   Mammogram  10/02/2024   DTaP/Tdap/Td vaccine (4 - Td or Tdap) 07/07/2029   Colon Cancer Screening  07/20/2029   Pneumonia Vaccine  Completed   Flu Shot  Completed   DEXA scan (bone density measurement)  Completed   Hepatitis C Screening  Completed   Zoster (Shingles) Vaccine  Completed   HPV Vaccine  Aged Out    Advanced directives: (Declined) Advance directive discussed with you today. Even though you declined this today, please call our office should you change your mind, and we can give you the proper paperwork for you to fill out.  Next Medicare Annual Wellness Visit scheduled for next year: Yes  *Preventive Care attachment *FALL PREVENTION attachment

## 2023-09-15 ENCOUNTER — Other Ambulatory Visit: Payer: Self-pay | Admitting: Internal Medicine

## 2023-10-07 ENCOUNTER — Ambulatory Visit
Admission: RE | Admit: 2023-10-07 | Discharge: 2023-10-07 | Disposition: A | Discharge status: Home / Self Care | Payer: 59 | Source: Ambulatory Visit | Attending: Internal Medicine | Admitting: Internal Medicine

## 2023-10-07 DIAGNOSIS — Z1231 Encounter for screening mammogram for malignant neoplasm of breast: Secondary | ICD-10-CM

## 2023-12-09 ENCOUNTER — Other Ambulatory Visit: Payer: Self-pay | Admitting: Internal Medicine

## 2023-12-09 ENCOUNTER — Other Ambulatory Visit: Payer: Self-pay

## 2023-12-25 ENCOUNTER — Other Ambulatory Visit: Payer: Self-pay

## 2023-12-25 ENCOUNTER — Other Ambulatory Visit: Payer: Self-pay | Admitting: Internal Medicine

## 2024-02-12 ENCOUNTER — Other Ambulatory Visit: Payer: Self-pay

## 2024-02-12 ENCOUNTER — Other Ambulatory Visit: Payer: Self-pay | Admitting: Internal Medicine

## 2024-04-22 DIAGNOSIS — H5203 Hypermetropia, bilateral: Secondary | ICD-10-CM | POA: Diagnosis not present

## 2024-04-22 DIAGNOSIS — H40033 Anatomical narrow angle, bilateral: Secondary | ICD-10-CM | POA: Diagnosis not present

## 2024-05-12 DIAGNOSIS — M1711 Unilateral primary osteoarthritis, right knee: Secondary | ICD-10-CM | POA: Diagnosis not present

## 2024-08-31 ENCOUNTER — Other Ambulatory Visit: Payer: Self-pay | Admitting: Internal Medicine

## 2024-08-31 DIAGNOSIS — Z0000 Encounter for general adult medical examination without abnormal findings: Secondary | ICD-10-CM

## 2024-09-13 ENCOUNTER — Ambulatory Visit (INDEPENDENT_AMBULATORY_CARE_PROVIDER_SITE_OTHER): Payer: 59

## 2024-09-13 VITALS — BP 152/78 | HR 78 | Ht 63.5 in | Wt 183.8 lb

## 2024-09-13 DIAGNOSIS — Z0000 Encounter for general adult medical examination without abnormal findings: Secondary | ICD-10-CM | POA: Diagnosis not present

## 2024-09-13 NOTE — Patient Instructions (Addendum)
 Claudia Myers,  Thank you for taking the time for your Medicare Wellness Visit. I appreciate your continued commitment to your health goals. Please review the care plan we discussed, and feel free to reach out if I can assist you further.  Medicare recommends these wellness visits once per year to help you and your care team stay ahead of potential health issues. These visits are designed to focus on prevention, allowing your provider to concentrate on managing your acute and chronic conditions during your regular appointments.  Please note that Annual Wellness Visits do not include a physical exam. Some assessments may be limited, especially if the visit was conducted virtually. If needed, we may recommend a separate in-person follow-up with your provider.  Ongoing Care Seeing your primary care provider every 3 to 6 months helps us  monitor your health and provide consistent, personalized care.   Referrals If a referral was made during today's visit and you haven't received any updates within two weeks, please contact the referred provider directly to check on the status.  Recommended Screenings:  Health Maintenance  Topic Date Due   Flu Shot  07/09/2024   COVID-19 Vaccine (7 - 2025-26 season) 08/09/2024   Medicare Annual Wellness Visit  09/13/2025   Breast Cancer Screening  10/06/2025   DTaP/Tdap/Td vaccine (4 - Td or Tdap) 07/07/2029   Colon Cancer Screening  07/20/2029   Pneumococcal Vaccine for age over 73  Completed   DEXA scan (bone density measurement)  Completed   Hepatitis C Screening  Completed   Zoster (Shingles) Vaccine  Completed   Meningitis B Vaccine  Aged Out       09/13/2024   10:15 AM  Advanced Directives  Does Patient Have a Medical Advance Directive? No  Would patient like information on creating a medical advance directive? No - Patient declined   Advance Care Planning is important because it: Ensures you receive medical care that aligns with your values, goals,  and preferences. Provides guidance to your family and loved ones, reducing the emotional burden of decision-making during critical moments.  Vision: Annual vision screenings are recommended for early detection of glaucoma, cataracts, and diabetic retinopathy. These exams can also reveal signs of chronic conditions such as diabetes and high blood pressure.  Dental: Annual dental screenings help detect early signs of oral cancer, gum disease, and other conditions linked to overall health, including heart disease and diabetes.

## 2024-09-13 NOTE — Progress Notes (Signed)
 Subjective:  Please attest and cosign this visit due to patients primary care provider not being in the office at the time the visit was completed.  (Pt of Dr Claudia Myers)   Claudia Myers is a 72 y.o. who presents for a Medicare Wellness preventive visit.  As a reminder, Annual Wellness Visits don't include a physical exam, and some assessments may be limited, especially if this visit is performed virtually. We may recommend an in-person follow-up visit with your provider if needed.  Visit Complete: In person  Persons Participating in Visit: Patient.  AWV Questionnaire: No: Patient Medicare AWV questionnaire was not completed prior to this visit.  Cardiac Risk Factors include: advanced age (>19men, >71 women);dyslipidemia;hypertension;obesity (BMI >30kg/m2)     Objective:    Today's Vitals   09/13/24 1016  BP: (!) 152/78  Pulse: 78  Weight: 183 lb 12.8 oz (83.4 kg)  Height: 5' 3.5 (1.613 m)   Body mass index is 32.05 kg/m.     09/13/2024   10:15 AM 09/11/2023    9:34 AM 09/02/2022   10:07 AM 02/14/2022    1:51 PM 08/30/2021   10:06 AM 10/24/2016   12:19 AM  Advanced Directives  Does Patient Have a Medical Advance Directive? No No No No No No   Would patient like information on creating a medical advance directive? No - Patient declined No - Patient declined No - Patient declined No - Patient declined Yes (MAU/Ambulatory/Procedural Areas - Information given)      Data saved with a previous flowsheet row definition    Current Medications (verified) Outpatient Encounter Medications as of 09/13/2024  Medication Sig   alendronate  (FOSAMAX ) 70 MG tablet TAKE 1 TABLET (70 MG TOTAL) BY MOUTH EVERY 7 DAYS WITH FULL GLASS WATER ON EMPTY STOMACH   amLODipine  (NORVASC ) 5 MG tablet TAKE 1 TABLET (5 MG TOTAL) BY MOUTH DAILY.   Cholecalciferol (VITAMIN D3) 50 MCG (2000 UT) TABS Take 2,000 Units by mouth daily.   Multiple Vitamins-Minerals (MULTIVITAMIN WITH MINERALS) tablet  Take 1 tablet by mouth daily. Centrum   rosuvastatin  (CRESTOR ) 20 MG tablet TAKE 1 TABLET BY MOUTH EVERY DAY   No facility-administered encounter medications on file as of 09/13/2024.    Allergies (verified) Patient has no known allergies.   History: Past Medical History:  Diagnosis Date   HYPERLIPIDEMIA 09/05/2008   HYPERTENSION 09/05/2008   Past Surgical History:  Procedure Laterality Date   ABDOMINAL HYSTERECTOMY     EYE SURGERY  2020   cataracts removal   knee arthoscopy Right 2021   Dr. 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  Tobacco Use   Smoking status: Never   Smokeless tobacco: Never  Vaping Use   Vaping status: Never Used  Substance and Sexual Activity   Alcohol use: Yes  Alcohol/week: 1.0 standard drink of alcohol    Types: 1 Glasses of wine per week    Comment: rarely   Drug use: No   Sexual activity: Not Currently  Other Topics Concern   Not on file  Social History Narrative   Not on file   Social Drivers of Health   Financial Resource Strain: Low Risk  (09/13/2024)   Overall Financial Resource Strain (CARDIA)    Difficulty of Paying Living Expenses: Not hard at all  Food Insecurity: No Food Insecurity (09/13/2024)   Hunger Vital Sign    Worried About Running Out of Food in the Last Year: Never true    Ran Out of Food in the Last Year: Never true  Transportation Needs: No  Transportation Needs (09/13/2024)   PRAPARE - Administrator, Civil Service (Medical): No    Lack of Transportation (Non-Medical): No  Physical Activity: Sufficiently Active (09/13/2024)   Exercise Vital Sign    Days of Exercise per Week: 5 days    Minutes of Exercise per Session: 60 min  Stress: No Stress Concern Present (09/13/2024)   [employer] of Occupational Health - Occupational Stress Questionnaire    Feeling of Stress: Not at all  Social Connections: Moderately Isolated (09/13/2024)   Social Connection and Isolation Panel    Frequency of Communication with Friends and Family: More than three times a week    Frequency of Social Gatherings with Friends and Family: More than three times a week    Attends Religious Services: More than 4 times per year    Active Member of [employer] or Organizations: No    Attends Banker Meetings: Never    Marital Status: Divorced    Tobacco Counseling Counseling given: Not Answered    Clinical Intake:  Pre-visit preparation completed: Yes  Pain : No/denies pain     Nutritional Risks: None Diabetes: No  Lab Results  Component Value Date   HGBA1C 5.8 12/25/2022   HGBA1C 5.8 10/26/2021   HGBA1C 5.8 07/13/2020     How often do you need to have someone help you when you read instructions, pamphlets, or other written materials from your doctor or pharmacy?: 1 - Never  Interpreter Needed?: No  Information entered by :: Verdie Saba, CMA   Activities of Daily Living     09/13/2024   10:18 AM  In your present state of health, do you have any difficulty performing the following activities:  Hearing? 0  Vision? 0  Difficulty concentrating or making decisions? 0  Walking or climbing stairs? 0  Dressing or bathing? 0  Doing errands, shopping? 0  Preparing Food and eating ? N  Using the Toilet? N  In the past six months, have you accidently leaked urine? N  Do you have problems with loss of bowel control?  N  Managing your Medications? N  Managing your Finances? N  Housekeeping or managing your Housekeeping? N    Patient Care Team: Norleen Claudia ORN, MD as PCP - Diedre Kristie Lamprey, MD as Consulting Physician (Gastroenterology) Lavonia Lye, MD as Consulting Physician (Ophthalmology) Ernie Cough, MD as Consulting Physician (Orthopedic Surgery) [place], My Krupp, [place] as Referring Physician (Optometry)  I have updated your Care Teams any recent Medical Services you may have received from other providers in the past year.     Assessment:   This is a routine wellness examination for Claudia Myers.  Hearing/Vision screen Hearing Screening - Comments:: Denies hearing difficulties   Vision Screening -  Comments:: Wears rx glasses - up to date with routine eye exams with M. Le   Goals Addressed               This Visit's Progress     Patient Stated (pt-stated)        Patient stated she plans to continue riding bike for exercising        Depression Screen     09/13/2024   10:19 AM 09/11/2023    9:36 AM 12/25/2022    9:42 AM 12/25/2022    9:26 AM 09/02/2022   10:06 AM 10/26/2021    3:57 PM 10/26/2021    3:27 PM  PHQ 2/9 Scores  PHQ - 2 Score 0 0 0 0 0 0 0  PHQ- 9 Score 0 0  0       Fall Risk     09/13/2024   10:19 AM 09/11/2023    9:35 AM 12/25/2022    9:42 AM 12/25/2022    9:26 AM 09/02/2022   10:07 AM  Fall Risk   Falls in the past year? 0 0 0 0 0  Number falls in past yr: 0 0 0 0 0  Injury with Fall? 0 0 0 0 0  Risk for fall due to : No Fall Risks No Fall Risks  No Fall Risks No Fall Risks  Follow up Falls evaluation completed;Falls prevention discussed Falls prevention discussed  Falls evaluation completed  Falls evaluation completed      Data saved with a previous flowsheet row definition    MEDICARE RISK AT HOME:  Medicare Risk at Home Any stairs in or around the home?: No If so, are there any without handrails?: No Home free of loose throw rugs in walkways, pet beds,  electrical cords, etc?: Yes Adequate lighting in your home to reduce risk of falls?: Yes Life alert?: No Use of a cane, walker or w/c?: No Grab bars in the bathroom?: Yes Shower chair or bench in shower?: No Elevated toilet seat or a handicapped toilet?: No  TIMED UP AND GO:  Was the test performed?  No  Cognitive Function: 6CIT completed        09/13/2024   10:22 AM 09/11/2023    9:37 AM 09/02/2022   10:09 AM  6CIT Screen  What Year? 0 points 0 points 0 points  What month? 0 points 0 points 0 points  What time? 0 points 0 points 0 points  Count back from 20 0 points 0 points 0 points  Months in reverse 0 points 0 points 0 points  Repeat phrase 0 points 0 points 0 points  Total Score 0 points 0 points 0 points    Immunizations Immunization History  Administered Date(s) Administered   Fluad Quad(high Dose 65+) 08/19/2019   INFLUENZA, HIGH DOSE SEASONAL PF 09/17/2018, 08/15/2020, 08/21/2021, 07/13/2022   Influenza Inj Mdck Quad Pf 10/06/2017   Influenza Whole 10/12/2008   Influenza,inj,Quad PF,6+ Mos 11/13/2016   Influenza-Unspecified 09/08/2018, 08/21/2021, 07/13/2022   PFIZER Comirnaty(Gray Top)Covid-19 Tri-Sucrose Vaccine 04/05/2021   PFIZER(Purple Top)SARS-COV-2 Vaccination 02/13/2020, 03/15/2020, 10/16/2020, 04/05/2021   PNEUMOCOCCAL CONJUGATE-20 09/11/2021   Pfizer Covid-19 Vaccine Bivalent Booster 32yrs & up 12/21/2021   Pneumococcal Conjugate-13 06/01/2018   Pneumococcal Polysaccharide-23 07/08/2019, 09/04/2020   Td 07/01/1997   Tdap 07/08/2019, 07/08/2019   Zoster Recombinant(Shingrix) 09/10/2019, 12/19/2019    Screening Tests Health Maintenance  Topic Date Due   Influenza Vaccine  07/09/2024   COVID-19 Vaccine (7 - 2025-26 season) 08/09/2024   Medicare  Annual Wellness (AWV)  09/13/2025   Mammogram  10/06/2025   DTaP/Tdap/Td (4 - Td or Tdap) 07/07/2029   Colonoscopy  07/20/2029   Pneumococcal Vaccine: 50+ Years  Completed   DEXA SCAN  Completed    Hepatitis C Screening  Completed   Zoster Vaccines- Shingrix  Completed   Meningococcal B Vaccine  Aged Out    Health Maintenance Items Addressed:  09/13/2024  Additional Screening:  Vision Screening: Recommended annual ophthalmology exams for early detection of glaucoma and other disorders of the eye. Is the patient up to date with their annual eye exam?  Yes  Who is the provider or what is the name of the office in which the patient attends annual eye exams? [facility]  Dental Screening: Recommended annual dental exams for proper oral hygiene  Community Resource Referral / Chronic Care Management: CRR required this visit?  No   CCM required this visit?  No   Plan:    I have personally reviewed and noted the following in the patient's chart:   Medical and social history Use of alcohol, tobacco or illicit drugs  Current medications and supplements including opioid prescriptions. Patient is not currently taking opioid prescriptions. Functional ability and status Nutritional status Physical activity Advanced directives List of other physicians Hospitalizations, surgeries, and ER visits in previous 12 months Vitals Screenings to include cognitive, depression, and falls Referrals and appointments  In addition, I have reviewed and discussed with patient certain preventive protocols, quality metrics, and best practice recommendations. A written personalized care plan for preventive services as well as general preventive health recommendations were provided to patient.   Verdie CHRISTELLA Saba, CMA   09/13/2024   After Visit Summary: (In Person-Declined) Patient declined AVS at this time.  Notes: Scheduled a 1-yr Physical w/PCP for 12/2024

## 2024-09-13 NOTE — Progress Notes (Signed)
 Subjective:  Please attest and cosign this visit due to patients primary care provider not being in the office at the time the visit was completed.  ([name])   Claudia Myers is a 72 y.o. who presents for a Medicare Wellness preventive visit.  As a reminder, Annual Wellness Visits don't include a physical exam, and some assessments may be limited, especially if this visit is performed virtually. We may recommend an in-person follow-up visit with your provider if needed.  Visit Complete: In person  Persons Participating in Visit: Patient.  AWV Questionnaire: No: Patient Medicare AWV questionnaire was not completed prior to this visit.  Cardiac Risk Factors include: advanced age (>19men, >71 women);dyslipidemia;hypertension;obesity (BMI >30kg/m2)     Objective:    Today's Vitals   09/13/24 1016  BP: (!) 152/78  Pulse: 78  Weight: 183 lb 12.8 oz (83.4 kg)  Height: 5' 3.5 (1.613 m)   Body mass index is 32.05 kg/m.     09/13/2024   10:15 AM 09/11/2023    9:34 AM 09/02/2022   10:07 AM 02/14/2022    1:51 PM 08/30/2021   10:06 AM 10/24/2016   12:19 AM  Advanced Directives  Does Patient Have a Medical Advance Directive? No No No No No No   Would patient like information on creating a medical advance directive? No - Patient declined No - Patient declined No - Patient declined No - Patient declined Yes (MAU/Ambulatory/Procedural Areas - Information given)      Data saved with a previous flowsheet row definition    Current Medications (verified) Outpatient Encounter Medications as of 09/13/2024  Medication Sig   alendronate  (FOSAMAX ) 70 MG tablet TAKE 1 TABLET (70 MG TOTAL) BY MOUTH EVERY 7 DAYS WITH FULL GLASS WATER ON EMPTY STOMACH   amLODipine  (NORVASC ) 5 MG tablet TAKE 1 TABLET (5 MG TOTAL) BY MOUTH DAILY.   Cholecalciferol (VITAMIN D3) 50 MCG (2000 UT) TABS Take 2,000 Units by mouth daily.   Multiple Vitamins-Minerals (MULTIVITAMIN WITH MINERALS) tablet  Take 1 tablet by mouth daily. Centrum   rosuvastatin  (CRESTOR ) 20 MG tablet TAKE 1 TABLET BY MOUTH EVERY DAY   No facility-administered encounter medications on file as of 09/13/2024.    Allergies (verified) Patient has no known allergies.   History: Past Medical History:  Diagnosis Date   HYPERLIPIDEMIA 09/05/2008   HYPERTENSION 09/05/2008   Past Surgical History:  Procedure Laterality Date   ABDOMINAL HYSTERECTOMY     EYE SURGERY  2020   cataracts removal   knee arthoscopy Right 2021   Dr. 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  Tobacco Use   Smoking status: Never   Smokeless tobacco: Never  Vaping Use   Vaping status: Never Used  Substance and Sexual Activity   Alcohol use: Yes  Alcohol/week: 1.0 standard drink of alcohol    Types: 1 Glasses of wine per week    Comment: rarely   Drug use: No   Sexual activity: Not Currently  Other Topics Concern   Not on file  Social History Narrative   Not on file   Social Drivers of Health   Financial Resource Strain: Low Risk  (09/13/2024)   Overall Financial Resource Strain (CARDIA)    Difficulty of Paying Living Expenses: Not hard at all  Food Insecurity: No Food Insecurity (09/13/2024)   Hunger Vital Sign    Worried About Running Out of Food in the Last Year: Never true    Ran Out of Food in the Last Year: Never true  Transportation Needs: No  Transportation Needs (09/13/2024)   PRAPARE - Administrator, Civil Service (Medical): No    Lack of Transportation (Non-Medical): No  Physical Activity: Sufficiently Active (09/13/2024)   Exercise Vital Sign    Days of Exercise per Week: 5 days    Minutes of Exercise per Session: 60 min  Stress: No Stress Concern Present (09/13/2024)   [employer] of Occupational Health - Occupational Stress Questionnaire    Feeling of Stress: Not at all  Social Connections: Moderately Isolated (09/13/2024)   Social Connection and Isolation Panel    Frequency of Communication with Friends and Family: More than three times a week    Frequency of Social Gatherings with Friends and Family: More than three times a week    Attends Religious Services: More than 4 times per year    Active Member of [employer] or Organizations: No    Attends Banker Meetings: Never    Marital Status: Divorced    Tobacco Counseling Counseling given: Not Answered    Clinical Intake:  Pre-visit preparation completed: Yes  Pain : No/denies pain     Nutritional Risks: None Diabetes: No  Lab Results  Component Value Date   HGBA1C 5.8 12/25/2022   HGBA1C 5.8 10/26/2021   HGBA1C 5.8 07/13/2020     How often do you need to have someone help you when you read instructions, pamphlets, or other written materials from your doctor or pharmacy?: 1 - Never  Interpreter Needed?: No  Information entered by :: Verdie Saba, CMA   Activities of Daily Living     09/13/2024   10:18 AM  In your present state of health, do you have any difficulty performing the following activities:  Hearing? 0  Vision? 0  Difficulty concentrating or making decisions? 0  Walking or climbing stairs? 0  Dressing or bathing? 0  Doing errands, shopping? 0  Preparing Food and eating ? N  Using the Toilet? N  In the past six months, have you accidently leaked urine? N  Do you have problems with loss of bowel control?  N  Managing your Medications? N  Managing your Finances? N  Housekeeping or managing your Housekeeping? N    Patient Care Team: Norleen Lynwood ORN, MD as PCP - Diedre Kristie Lamprey, MD as Consulting Physician (Gastroenterology) Lavonia Lye, MD as Consulting Physician (Ophthalmology) Ernie Cough, MD as Consulting Physician (Orthopedic Surgery) [place], My Krupp, [place] as Referring Physician (Optometry)  I have updated your Care Teams any recent Medical Services you may have received from other providers in the past year.     Assessment:   This is a routine wellness examination for Claudia Myers.  Hearing/Vision screen Hearing Screening - Comments:: Denies hearing difficulties   Vision Screening -  Comments:: Wears rx glasses - up to date with routine eye exams with M. Le   Goals Addressed               This Visit's Progress     Patient Stated (pt-stated)        Patient stated she plans to continue riding bike for exercising        Depression Screen     09/13/2024   10:19 AM 09/11/2023    9:36 AM 12/25/2022    9:42 AM 12/25/2022    9:26 AM 09/02/2022   10:06 AM 10/26/2021    3:57 PM 10/26/2021    3:27 PM  PHQ 2/9 Scores  PHQ - 2 Score 0 0 0 0 0 0 0  PHQ- 9 Score 0 0  0       Fall Risk     09/13/2024   10:19 AM 09/11/2023    9:35 AM 12/25/2022    9:42 AM 12/25/2022    9:26 AM 09/02/2022   10:07 AM  Fall Risk   Falls in the past year? 0 0 0 0 0  Number falls in past yr: 0 0 0 0 0  Injury with Fall? 0 0 0 0 0  Risk for fall due to : No Fall Risks No Fall Risks  No Fall Risks No Fall Risks  Follow up Falls evaluation completed;Falls prevention discussed Falls prevention discussed  Falls evaluation completed  Falls evaluation completed      Data saved with a previous flowsheet row definition    MEDICARE RISK AT HOME:  Medicare Risk at Home Any stairs in or around the home?: No If so, are there any without handrails?: No Home free of loose throw rugs in walkways, pet beds,  electrical cords, etc?: Yes Adequate lighting in your home to reduce risk of falls?: Yes Life alert?: No Use of a cane, walker or w/c?: No Grab bars in the bathroom?: Yes Shower chair or bench in shower?: No Elevated toilet seat or a handicapped toilet?: No  TIMED UP AND GO:  Was the test performed?  No  Cognitive Function: 6CIT completed        09/13/2024   10:22 AM 09/11/2023    9:37 AM 09/02/2022   10:09 AM  6CIT Screen  What Year? 0 points 0 points 0 points  What month? 0 points 0 points 0 points  What time? 0 points 0 points 0 points  Count back from 20 0 points 0 points 0 points  Months in reverse 0 points 0 points 0 points  Repeat phrase 0 points 0 points 0 points  Total Score 0 points 0 points 0 points    Immunizations Immunization History  Administered Date(s) Administered   Fluad Quad(high Dose 65+) 08/19/2019   INFLUENZA, HIGH DOSE SEASONAL PF 09/17/2018, 08/15/2020, 08/21/2021, 07/13/2022   Influenza Inj Mdck Quad Pf 10/06/2017   Influenza Whole 10/12/2008   Influenza,inj,Quad PF,6+ Mos 11/13/2016   Influenza-Unspecified 09/08/2018, 08/21/2021, 07/13/2022   PFIZER Comirnaty(Gray Top)Covid-19 Tri-Sucrose Vaccine 04/05/2021   PFIZER(Purple Top)SARS-COV-2 Vaccination 02/13/2020, 03/15/2020, 10/16/2020, 04/05/2021   PNEUMOCOCCAL CONJUGATE-20 09/11/2021   Pfizer Covid-19 Vaccine Bivalent Booster 32yrs & up 12/21/2021   Pneumococcal Conjugate-13 06/01/2018   Pneumococcal Polysaccharide-23 07/08/2019, 09/04/2020   Td 07/01/1997   Tdap 07/08/2019, 07/08/2019   Zoster Recombinant(Shingrix) 09/10/2019, 12/19/2019    Screening Tests Health Maintenance  Topic Date Due   Influenza Vaccine  07/09/2024   COVID-19 Vaccine (7 - 2025-26 season) 08/09/2024   Medicare  Annual Wellness (AWV)  09/13/2025   Mammogram  10/06/2025   DTaP/Tdap/Td (4 - Td or Tdap) 07/07/2029   Colonoscopy  07/20/2029   Pneumococcal Vaccine: 50+ Years  Completed   DEXA SCAN  Completed    Hepatitis C Screening  Completed   Zoster Vaccines- Shingrix  Completed   Meningococcal B Vaccine  Aged Out    Health Maintenance Items Addressed:  09/13/2024  Additional Screening:  Vision Screening: Recommended annual ophthalmology exams for early detection of glaucoma and other disorders of the eye. Is the patient up to date with their annual eye exam?  Yes  Who is the provider or what is the name of the office in which the patient attends annual eye exams? [facility]  Dental Screening: Recommended annual dental exams for proper oral hygiene  Community Resource Referral / Chronic Care Management: CRR required this visit?  No   CCM required this visit?  No   Plan:    I have personally reviewed and noted the following in the patient's chart:   Medical and social history Use of alcohol, tobacco or illicit drugs  Current medications and supplements including opioid prescriptions. Patient is not currently taking opioid prescriptions. Functional ability and status Nutritional status Physical activity Advanced directives List of other physicians Hospitalizations, surgeries, and ER visits in previous 12 months Vitals Screenings to include cognitive, depression, and falls Referrals and appointments  In addition, I have reviewed and discussed with patient certain preventive protocols, quality metrics, and best practice recommendations. A written personalized care plan for preventive services as well as general preventive health recommendations were provided to patient.   Verdie CHRISTELLA Saba, CMA   09/13/2024   After Visit Summary: (In Person-Declined) Patient declined AVS at this time.  Notes: Scheduled a 1-yr Physical w/PCP for 12/2024

## 2024-10-07 ENCOUNTER — Ambulatory Visit
Admission: RE | Admit: 2024-10-07 | Discharge: 2024-10-07 | Disposition: A | Discharge status: Home / Self Care | Source: Ambulatory Visit | Attending: Internal Medicine | Admitting: Internal Medicine

## 2024-10-07 DIAGNOSIS — Z0000 Encounter for general adult medical examination without abnormal findings: Secondary | ICD-10-CM

## 2024-12-13 ENCOUNTER — Encounter: Admitting: Internal Medicine

## 2024-12-27 ENCOUNTER — Encounter: Admitting: Internal Medicine

## 2024-12-29 ENCOUNTER — Other Ambulatory Visit: Payer: Self-pay | Admitting: Internal Medicine

## 2025-01-03 ENCOUNTER — Telehealth: Payer: Self-pay

## 2025-01-03 DIAGNOSIS — M1710 Unilateral primary osteoarthritis, unspecified knee: Secondary | ICD-10-CM

## 2025-01-03 NOTE — Telephone Encounter (Signed)
 Unfortunately I would not be able to refer to Rheumatology (that I think she is requesting?) as she has not hx or suspicion of an inflammatory arthritis or other rheumatic disease.   It should be fine to refer to orthopedic though, but please clarify if this is for the knee arthritis.   thanks

## 2025-01-03 NOTE — Telephone Encounter (Signed)
 Copied from CRM [id number]. Topic: Referral - Request for Referral >> Dec 31, 2024 11:54 AM Willma R wrote: Did the patient discuss referral with their provider in the last year? Yes  Appointment offered? No  Type of order/referral and detailed reason for visit: [facility]  Preference of office, provider, location:  [facility] [address] [place], [place] [address] Phone: [phone] Fax: [phone]  If referral order, have you been seen by this specialty before? Yes, [facility] but needs a new referral per [facility].  Can we respond through MyChart? No

## 2025-01-04 NOTE — Telephone Encounter (Signed)
 Ok sure, referral is now done to ortho Dr Melodi, thanks

## 2025-01-04 NOTE — Telephone Encounter (Unsigned)
 Copied from CRM #[id number]. Topic: Referral - Status >> Jan 04, 2025  4:06 PM Hadassah PARAS wrote: Reason for CRM: [facility] in [place] calling to advised fax referral was sent to office. They are needing this refaxed over to proceed w pt care. Please advise on [phone]

## 2025-01-08 ENCOUNTER — Other Ambulatory Visit: Payer: Self-pay | Admitting: Internal Medicine

## 2025-01-11 ENCOUNTER — Other Ambulatory Visit: Payer: Self-pay | Admitting: Internal Medicine

## 2025-01-12 ENCOUNTER — Other Ambulatory Visit: Payer: Self-pay | Admitting: Internal Medicine

## 2025-01-14 ENCOUNTER — Other Ambulatory Visit: Payer: Self-pay

## 2025-01-17 ENCOUNTER — Ambulatory Visit: Admitting: Family Medicine

## 2025-05-26 ENCOUNTER — Encounter: Admitting: Nurse Practitioner

## 2025-09-14 ENCOUNTER — Ambulatory Visit
